# Patient Record
Sex: Female | Born: 2000 | Race: Black or African American | Hispanic: No | Marital: Single | Smoking: Never smoker
Health system: Southern US, Community
[De-identification: ages and names within clinical notes are randomized; demographics above are authoritative.]

## PROBLEM LIST (undated history)

## (undated) DIAGNOSIS — E559 Vitamin D deficiency, unspecified: Secondary | ICD-10-CM

## (undated) DIAGNOSIS — E611 Iron deficiency: Secondary | ICD-10-CM

## (undated) DIAGNOSIS — G43109 Migraine with aura, not intractable, without status migrainosus: Secondary | ICD-10-CM

## (undated) DIAGNOSIS — B9681 Helicobacter pylori [H. pylori] as the cause of diseases classified elsewhere: Secondary | ICD-10-CM

## (undated) DIAGNOSIS — K2970 Gastritis, unspecified, without bleeding: Secondary | ICD-10-CM

## (undated) DIAGNOSIS — E785 Hyperlipidemia, unspecified: Secondary | ICD-10-CM

## (undated) HISTORY — DX: Hyperlipidemia, unspecified: E78.5

## (undated) HISTORY — DX: Vitamin D deficiency, unspecified: E55.9

## (undated) HISTORY — DX: Helicobacter pylori (H. pylori) as the cause of diseases classified elsewhere: B96.81

## (undated) HISTORY — DX: Migraine with aura, not intractable, without status migrainosus: G43.109

## (undated) HISTORY — DX: Iron deficiency: E61.1

## (undated) HISTORY — DX: Helicobacter pylori (H. pylori) as the cause of diseases classified elsewhere: K29.70

---
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
## 2020-04-24 DIAGNOSIS — A599 Trichomoniasis, unspecified: Secondary | ICD-10-CM

## 2020-04-24 HISTORY — PX: WISDOM TOOTH EXTRACTION: SHX21

## 2020-04-24 HISTORY — DX: Trichomoniasis, unspecified: A59.9

## 2021-07-05 ENCOUNTER — Emergency Department (HOSPITAL_BASED_OUTPATIENT_CLINIC_OR_DEPARTMENT_OTHER): Payer: Medicaid Other

## 2021-07-05 ENCOUNTER — Emergency Department (HOSPITAL_BASED_OUTPATIENT_CLINIC_OR_DEPARTMENT_OTHER)
Admission: EM | Admit: 2021-07-05 | Discharge: 2021-07-05 | Disposition: A | Discharge status: Home / Self Care | Payer: Medicaid Other | Attending: Emergency Medicine | Admitting: Emergency Medicine

## 2021-07-05 ENCOUNTER — Other Ambulatory Visit: Payer: Self-pay

## 2021-07-05 ENCOUNTER — Encounter (HOSPITAL_BASED_OUTPATIENT_CLINIC_OR_DEPARTMENT_OTHER): Payer: Self-pay | Admitting: Emergency Medicine

## 2021-07-05 DIAGNOSIS — R791 Abnormal coagulation profile: Secondary | ICD-10-CM | POA: Insufficient documentation

## 2021-07-05 DIAGNOSIS — R519 Headache, unspecified: Secondary | ICD-10-CM | POA: Insufficient documentation

## 2021-07-05 DIAGNOSIS — R0602 Shortness of breath: Secondary | ICD-10-CM | POA: Diagnosis present

## 2021-07-05 DIAGNOSIS — J020 Streptococcal pharyngitis: Secondary | ICD-10-CM

## 2021-07-05 DIAGNOSIS — R079 Chest pain, unspecified: Secondary | ICD-10-CM | POA: Diagnosis not present

## 2021-07-05 DIAGNOSIS — Z20822 Contact with and (suspected) exposure to covid-19: Secondary | ICD-10-CM | POA: Diagnosis not present

## 2021-07-05 LAB — CBC WITH DIFFERENTIAL/PLATELET
Abs Immature Granulocytes: 0.01 10*3/uL (ref 0.00–0.07)
Basophils Absolute: 0 10*3/uL (ref 0.0–0.1)
Basophils Relative: 0 %
Eosinophils Absolute: 0.1 10*3/uL (ref 0.0–0.5)
Eosinophils Relative: 1 %
HCT: 41.7 % (ref 36.0–46.0)
Hemoglobin: 14 g/dL (ref 12.0–15.0)
Immature Granulocytes: 0 %
Lymphocytes Relative: 25 %
Lymphs Abs: 1.6 10*3/uL (ref 0.7–4.0)
MCH: 30.7 pg (ref 26.0–34.0)
MCHC: 33.6 g/dL (ref 30.0–36.0)
MCV: 91.4 fL (ref 80.0–100.0)
Monocytes Absolute: 0.4 10*3/uL (ref 0.1–1.0)
Monocytes Relative: 5 %
Neutro Abs: 4.4 10*3/uL (ref 1.7–7.7)
Neutrophils Relative %: 69 %
Platelets: 319 10*3/uL (ref 150–400)
RBC: 4.56 MIL/uL (ref 3.87–5.11)
RDW: 13 % (ref 11.5–15.5)
WBC: 6.4 10*3/uL (ref 4.0–10.5)
nRBC: 0 % (ref 0.0–0.2)

## 2021-07-05 LAB — COMPREHENSIVE METABOLIC PANEL
ALT: 23 U/L (ref 0–44)
AST: 20 U/L (ref 15–41)
Albumin: 4.1 g/dL (ref 3.5–5.0)
Alkaline Phosphatase: 55 U/L (ref 38–126)
Anion gap: 8 (ref 5–15)
BUN: 10 mg/dL (ref 6–20)
CO2: 25 mmol/L (ref 22–32)
Calcium: 9.6 mg/dL (ref 8.9–10.3)
Chloride: 103 mmol/L (ref 98–111)
Creatinine, Ser: 0.75 mg/dL (ref 0.44–1.00)
GFR, Estimated: >60 mL/min (ref >60)
Glucose, Bld: 83 mg/dL (ref 70–99)
Potassium: 4 mmol/L (ref 3.5–5.1)
Sodium: 136 mmol/L (ref 135–145)
Total Bilirubin: 0.5 mg/dL (ref 0.3–1.2)
Total Protein: 8.5 g/dL — ABNORMAL HIGH (ref 6.5–8.1)

## 2021-07-05 LAB — URINALYSIS, ROUTINE W REFLEX MICROSCOPIC
Bilirubin Urine: NEGATIVE
Glucose, UA: NEGATIVE mg/dL
Hgb urine dipstick: NEGATIVE
Ketones, ur: NEGATIVE mg/dL
Leukocytes,Ua: NEGATIVE
Nitrite: NEGATIVE
Protein, ur: NEGATIVE mg/dL
Specific Gravity, Urine: 1.015 (ref 1.005–1.030)
pH: 7 (ref 5.0–8.0)

## 2021-07-05 LAB — RESP PANEL BY RT-PCR (FLU A&B, COVID) ARPGX2
Influenza A by PCR: NEGATIVE
Influenza B by PCR: NEGATIVE
SARS Coronavirus 2 by RT PCR: NEGATIVE

## 2021-07-05 LAB — GROUP A STREP BY PCR: Group A Strep by PCR: DETECTED — AB

## 2021-07-05 LAB — D-DIMER, QUANTITATIVE: D-Dimer, Quant: 0.63 ug/mL-FEU — ABNORMAL HIGH (ref 0.00–0.50)

## 2021-07-05 LAB — PREGNANCY, URINE: Preg Test, Ur: NEGATIVE

## 2021-07-05 MED ORDER — AMOXICILLIN 500 MG PO CAPS: 500.0000 mg | ORAL_CAPSULE | Freq: Three times a day (TID) | ORAL | 0 refills | Status: DC

## 2021-07-05 MED ORDER — IOHEXOL 350 MG/ML SOLN
75.0000 mL | Freq: Once | INTRAVENOUS | Status: AC | PRN
  Administered 2021-07-05: 75 mL via INTRAVENOUS

## 2021-07-05 MED ORDER — KETOROLAC TROMETHAMINE 30 MG/ML IJ SOLN
30.0000 mg | Freq: Once | INTRAMUSCULAR | Status: AC
  Administered 2021-07-05: 30 mg via INTRAVENOUS
  Filled 2021-07-05: qty 1

## 2021-07-05 MED ORDER — SODIUM CHLORIDE 0.9 % IV BOLUS
1000.0000 mL | Freq: Once | INTRAVENOUS | Status: AC
  Administered 2021-07-05: 1000 mL via INTRAVENOUS

## 2021-07-05 NOTE — ED Triage Notes (Signed)
SOB since this am.  Pt states she was sitting down writing when it started.  Pt has shallow respirations.  Hyperventilating.  Trembling.  Pt also c/o headache.  No recent travel.  Pt takes birth control and smokes.  Pox with ambulation was 100 %. ?

## 2021-07-05 NOTE — ED Provider Notes (Signed)
?[facility] EMERGENCY DEPARTMENT ?Provider Note ? ? ?CSN: [id number] ?Arrival date & time: 07/05/21  1143 ? ?  ? ?History ? ?Chief Complaint  ?Patient presents with  ? Shortness of Breath  ? ? ?Grace Marshall is a 21 y.o. female. ? ?Pt is a 21 yo female with no significant pmhx.  She said she has had sob with left sided cp.  She also has a headache.  She had a cold a few days ago.  She did a home covid test and it was negative.  Pt denies f/c.   ? ? ?  ? ?Home Medications ?Prior to Admission medications   ?Not on File  ?   ? ?Allergies    ?Patient has no known allergies.   ? ?Review of Systems   ?Review of Systems  ?HENT:  Positive for congestion.   ?Respiratory:  Positive for shortness of breath.   ?Cardiovascular:  Positive for chest pain.  ?Neurological:  Negative for headaches.  ?All other systems reviewed and are negative. ? ?Physical Exam ?Updated Vital Signs ?BP 105/71   Pulse 62   Temp 97.7 ?F (36.5 ?C)   Resp 18   Ht 5\' 2"  (1.575 m)   Wt 77 kg   LMP 06/27/2021   SpO2 100%   BMI 31.04 kg/m?  ?Physical Exam ?Vitals and nursing note reviewed.  ?Constitutional:   ?   Appearance: She is well-developed.  ?HENT:  ?   Head: Normocephalic and atraumatic.  ?   Mouth/Throat:  ?   Tonsils: 4+ on the right. 4+ on the left.  ?Eyes:  ?   Extraocular Movements: Extraocular movements intact.  ?   Pupils: Pupils are equal, round, and reactive to light.  ?Cardiovascular:  ?   Rate and Rhythm: Normal rate and regular rhythm.  ?Pulmonary:  ?   Effort: Pulmonary effort is normal.  ?   Breath sounds: Normal breath sounds.  ?Abdominal:  ?   General: Bowel sounds are normal.  ?   Palpations: Abdomen is soft.  ?Musculoskeletal:     ?   General: Normal range of motion.  ?   Cervical back: Normal range of motion and neck supple.  ?Skin: ?   General: Skin is warm.  ?   Capillary Refill: Capillary refill takes less than 2 seconds.  ?Neurological:  ?   General: No focal deficit present.  ?   Mental Status: She is alert  and oriented to person, place, and time.  ?Psychiatric:     ?   Mood and Affect: Mood normal.     ?   Behavior: Behavior normal.  ? ? ?ED Results / Procedures / Treatments   ?Labs ?(all labs ordered are listed, but only abnormal results are displayed) ?Labs Reviewed  ?COMPREHENSIVE METABOLIC PANEL - Abnormal; Notable for the following components:  ?    Result Value  ? Total Protein 8.5 (*)   ? All other components within normal limits  ?D-DIMER, QUANTITATIVE (NOT AT [facility]) - Abnormal; Notable for the following components:  ? D-Dimer, Quant 0.63 (*)   ? All other components within normal limits  ?RESP PANEL BY RT-PCR (FLU A&B, COVID) ARPGX2  ?GROUP A STREP BY PCR  ?CBC WITH DIFFERENTIAL/PLATELET  ?URINALYSIS, ROUTINE W REFLEX MICROSCOPIC  ?PREGNANCY, URINE  ? ? ?EKG ?EKG Interpretation ? ?Date/Time:  Tuesday July 05 2021 14:39:49 EDT ?Ventricular Rate:  73 ?PR Interval:  160 ?QRS Duration: 87 ?QT Interval:  392 ?QTC Calculation: 432 ?R Axis:  85 ?Text Interpretation: Sinus arrhythmia No old tracing to compare Confirmed by Jacalyn Lefevre [phone]) on 07/05/2021 4:29:41 PM ? ?Radiology ?DG Chest 2 View ? ?Result Date: 07/05/2021 ?CLINICAL DATA:  Chest pain.  Shortness of breath. EXAM: CHEST - 2 VIEW COMPARISON:  None. FINDINGS: Cardiac silhouette and mediastinal contours are within normal limits. The lungs are clear. No pleural effusion or pneumothorax. No acute skeletal abnormality. IMPRESSION: No active cardiopulmonary disease. Electronically Signed   By: Neita Garnet M.D.   On: 07/05/2021 14:42  ? ?CT Head Wo Contrast ? ?Result Date: 07/05/2021 ?CLINICAL DATA:  Severe headaches EXAM: CT HEAD WITHOUT CONTRAST TECHNIQUE: Contiguous axial images were obtained from the base of the skull through the vertex without intravenous contrast. RADIATION DOSE REDUCTION: This exam was performed according to the departmental dose-optimization program which includes automated exposure control, adjustment of the mA and/or kV according  to patient size and/or use of iterative reconstruction technique. COMPARISON:  None. FINDINGS: Brain: No acute intracranial findings are seen. Ventricles are not dilated. There is no shift of midline structures. There are no epidural or subdural fluid collections. Vascular: Unremarkable. Skull: Unremarkable. Sinuses/Orbits: Unremarkable. Other: None IMPRESSION: No acute intracranial findings are seen in noncontrast CT brain. Electronically Signed   By: Ernie Avena M.D.   On: 07/05/2021 14:44   ? ?Procedures ?Procedures  ? ? ?Medications Ordered in ED ?Medications  ?ketorolac (TORADOL) 30 MG/ML injection 30 mg (has no administration in time range)  ?sodium chloride 0.9 % bolus 1,000 mL (1,000 mLs Intravenous New Bag/Given 07/05/21 1509)  ? ? ?ED Course/ Medical Decision Making/ A&P ?Clinical Course as of 07/05/21 1631  ?Tue Jul 05, 2021  ?1628 CT Head Wo Contrast [[facility]]  ?  ?Clinical Course User Index ?[[facility]] Jacalyn Lefevre, MD  ? ?                        ?Medical Decision Making ?Amount and/or Complexity of Data Reviewed ?Labs: ordered. ?Radiology: ordered. ? ?Risk ?Prescription drug management. ? ? ?This patient presents to the ED for concern of headache and sob with cp, this involves an extensive number of treatment options, and is a complaint that carries with it a high risk of complications and morbidity.  The differential diagnosis includes brain abn, pulmonary abn, cardiac abn ? ? ?Co morbidities that complicate the patient evaluation ? ?none ? ? ?Additional history obtained: ? ?Additional history obtained from epic chart review ?External records from outside source obtained and reviewed including friend ? ? ?Lab Tests: ? ?I Ordered, and personally interpreted labs.  The pertinent results include:  cbc nl, cmp nl, ua neg, covid/flu neg, preg neg ? ? ?Imaging Studies ordered: ? ?I ordered imaging studies including ct head and cxr and ct chest  ?I independently visualized and interpreted imaging which showed  CT head: ?  ?IMPRESSION:  ?No acute intracranial findings are seen in noncontrast CT brain.  ?   ?CXR: ?  ?IMPRESSION:  ?No active cardiopulmonary disease.  ? ?I agree with the radiologist interpretation ? ? ?Cardiac Monitoring: ? ?The patient was maintained on a cardiac monitor.  I personally viewed and interpreted the cardiac monitored which showed an underlying rhythm of: nsr ? ? ?Medicines ordered and prescription drug management: ? ?I ordered medication including toradol/ivfs  for pain  ?Reevaluation of the patient after these medicines showed that the patient improved ?I have reviewed the patients home medicines and have made adjustments as needed ? ? ?Test Considered: ? ?  Ct chest:  result signed out to Dr. Deretha EmoryZackowski ? ? ?Problem List / ED Course: ? ?Headache:  re-eval after toradol and fluids ?CP and sob:  chest ct pending  ? ? ?Social Determinants of Health: ? ?Lives at home ? ?Pt signed out to Dr. Deretha EmoryZackowski at shift change. ? ? ? ? ? ? ? ?Final Clinical Impression(s) / ED Diagnoses ?Final diagnoses:  ?SOB (shortness of breath)  ?Acute nonintractable headache, unspecified headache type  ? ? ?Rx / DC Orders ?ED Discharge Orders   ? ? None  ? ?  ? ? ?  ?Jacalyn LefevreHaviland, Melodee Lupe, MD ?07/05/21 1631 ? ?

## 2021-07-05 NOTE — ED Provider Notes (Signed)
Patient overall feeling better.  Strep test is positive.  Patient may be a strep carrier she was treated several weeks ago for positive strep.  Tonsils are still enlarged.  Patient without significant plaint of sore throat.  But will retreat with amoxicillin patient if gets significantly better on that that probably did help.  Otherwise she may be a carrier.  D-dimer was elevated so she had CT angio chest.  The CT angio chest had no acute findings.  No PE no evidence of any pneumonia. ? ?Patient will follow-up with primary care doctor will take the amoxicillin.  Work note provided.  Will return for any new or worse symptoms. ?  ?Vanetta Mulders, MD ?07/05/21 1831 ? ?

## 2021-07-05 NOTE — Discharge Instructions (Addendum)
Strep was positive.  As we talked you may be a strep carrier but take the antibiotic as directed 3 times a day.  COVID flu testing negative the specialized CAT scan of your chest ruled out any blood clots or any concerns for pneumonia.  Work note provided to be out of work for 2 days.  Can appointment to follow-up with your regular doctor. ?

## 2021-07-05 NOTE — ED Notes (Signed)
Pt. Walked to restroom with no difficulty 

## 2021-07-05 NOTE — ED Notes (Signed)
Pt. Reports she has a headache starting this morning at 9am that was different than a normal migraine. Pt. Reports the headache is on the L side over the L brow area. ? 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 ?

## 2022-06-17 ENCOUNTER — Emergency Department (HOSPITAL_BASED_OUTPATIENT_CLINIC_OR_DEPARTMENT_OTHER)
Admission: EM | Admit: 2022-06-17 | Discharge: 2022-06-17 | Disposition: A | Discharge status: Home / Self Care | Payer: Medicaid Other | Attending: Emergency Medicine | Admitting: Emergency Medicine

## 2022-06-17 ENCOUNTER — Other Ambulatory Visit: Payer: Self-pay

## 2022-06-17 ENCOUNTER — Encounter (HOSPITAL_BASED_OUTPATIENT_CLINIC_OR_DEPARTMENT_OTHER): Payer: Self-pay | Admitting: Emergency Medicine

## 2022-06-17 DIAGNOSIS — M25511 Pain in right shoulder: Secondary | ICD-10-CM | POA: Insufficient documentation

## 2022-06-17 MED ORDER — METHYLPREDNISOLONE 4 MG PO TBPK: ORAL_TABLET | ORAL | 0 refills | Status: DC

## 2022-06-17 NOTE — ED Triage Notes (Signed)
Pt c/o right shoulder pain that radiates down to fingers x 1-2 weeks. Pt seen by PCP for same and had an x-ray done that was negative. Pt is waiting clearance from insurance for MRI to be done. Pt denies inury.

## 2022-06-17 NOTE — ED Provider Notes (Signed)
[facility] Provider Note   CSN: [id number] Arrival date & time: 06/17/22  0815     History  Chief Complaint  Patient presents with   Shoulder Pain    Grace Marshall is a 22 y.o. female.  Patient here with right shoulder pain for the last 1 to 2 weeks.  X-rays done with primary care doctor were normal.  Is awaiting to have MRI done.  Has not had physical therapy.  Taken over-the-counter medications without much relief.  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  History of athletics in the past.  Denies any prior trauma.  Denies any weakness or numbness.  Sometimes she will have some tingling down her arm.  Denies any neck pain, loss consciousness or headache.  The history is provided by the patient.       Home Medications Prior to Admission medications   Medication Sig Start Date End Date Taking? Authorizing Provider  methylPREDNISolone (MEDROL DOSEPAK) 4 MG TBPK tablet Follow package insert 06/17/22  Yes Tyr Franca, DO  amoxicillin (AMOXIL) 500 MG capsule Take 1 capsule (500 mg total) by mouth 3 (three) times daily. 07/05/21   Fredia Sorrow, MD      Allergies    Patient has no known allergies.    Review of Systems   Review of Systems  Physical Exam Updated Vital Signs BP 122/83   Pulse 86   Temp 98.9 F (37.2 C)   Resp 17   Ht '5\' 2"'$  (1.575 m)   Wt 87.5 kg   LMP 06/03/2022   SpO2 98%   BMI 35.30 kg/m  Physical Exam Vitals and nursing note reviewed.  Constitutional:      General: She is not in acute distress.    Appearance: She is well-developed. She is not ill-appearing.  HENT:     Head: Normocephalic and atraumatic.     Nose: Nose normal.     Mouth/Throat:     Mouth: Mucous membranes are moist.  Eyes:     Extraocular Movements: Extraocular movements intact.     Conjunctiva/sclera: Conjunctivae normal.     Pupils: Pupils are equal, round, and reactive to light.  Cardiovascular:      Rate and Rhythm: Normal rate and regular rhythm.     Heart sounds: No murmur heard. Pulmonary:     Effort: Pulmonary effort is normal. No respiratory distress.     Breath sounds: Normal breath sounds.  Abdominal:     Palpations: Abdomen is soft.     Tenderness: There is no abdominal tenderness.  Musculoskeletal:        General: Tenderness present. No swelling. Normal range of motion.     Cervical back: Normal range of motion and neck supple.     Comments: Tenderness around the right bicipital groove/right AC  Skin:    General: Skin is warm and dry.     Capillary Refill: Capillary refill takes less than 2 seconds.  Neurological:     General: No focal deficit present.     Mental Status: She is alert and oriented to person, place, and time.     Cranial Nerves: No cranial nerve deficit.     Sensory: No sensory deficit.     Motor: No weakness.     Coordination: Coordination normal.     Comments: 5+ out of 5 strength, normal sensation  Psychiatric:        Mood and Affect: Mood normal.  ED Results / Procedures / Treatments   Labs (all labs ordered are listed, but only abnormal results are displayed) Labs Reviewed - No data to display  EKG None  Radiology No results found.  Procedures Procedures    Medications Ordered in ED Medications - No data to display  ED Course/ Medical Decision Making/ A&P                             Medical Decision Making Risk Prescription drug management.   Teather Jasso is here with right shoulder pain.  Normal vitals.  No fever.  Differential diagnosis likely soft tissue process or sprain or strain overuse injury.  Already had x-ray with primary care doctor that was normal.  Has been sent for MRI but has not gotten it yet.  Overall she is neurovascular neuromuscular intact.  My suspicion is that this is some sort of tendinitis or inflammatory process.  No concern for infectious process.  Neurologically neurovascular she is intact.  Will  have her follow-up with sports medicine.  Will put her in for some steroids to see if we can help with any inflammation.  Recommend continued use of Tylenol.  Discharged in good condition.  This chart was dictated using voice recognition software.  Despite best efforts to proofread,  errors can occur which can change the documentation meaning.         Final Clinical Impression(s) / ED Diagnoses Final diagnoses:  Acute pain of right shoulder    Rx / DC Orders ED Discharge Orders          Ordered    methylPREDNISolone (MEDROL DOSEPAK) 4 MG TBPK tablet        06/17/22 0831              Lennice Sites, DO 06/17/22 [phone]

## 2022-06-23 ENCOUNTER — Ambulatory Visit: Payer: Self-pay

## 2022-06-23 ENCOUNTER — Encounter: Payer: Self-pay | Admitting: Family Medicine

## 2022-06-23 ENCOUNTER — Ambulatory Visit (INDEPENDENT_AMBULATORY_CARE_PROVIDER_SITE_OTHER): Payer: Medicaid Other | Admitting: Family Medicine

## 2022-06-23 VITALS — BP 112/70 | Ht 62.0 in | Wt 193.0 lb

## 2022-06-23 DIAGNOSIS — M25511 Pain in right shoulder: Secondary | ICD-10-CM

## 2022-06-23 DIAGNOSIS — M65811 Other synovitis and tenosynovitis, right shoulder: Secondary | ICD-10-CM

## 2022-06-23 DIAGNOSIS — S43431D Superior glenoid labrum lesion of right shoulder, subsequent encounter: Secondary | ICD-10-CM | POA: Insufficient documentation

## 2022-06-23 MED ORDER — CELECOXIB 100 MG PO CAPS: 100.0000 mg | ORAL_CAPSULE | Freq: Two times a day (BID) | ORAL | 1 refills | Status: DC

## 2022-06-23 NOTE — Patient Instructions (Signed)
Nice to meet you Please try heat  Please try the range of motion movements  We'll call with the results.   Please send me a message in MyChart with any questions or updates.  Please see me back in 2-3 weeks.   --Dr. Raeford Razor

## 2022-06-23 NOTE — Assessment & Plan Note (Signed)
Acute occurring with effusion in the shoulder joint. No injury. Appears more inflammatory than mechanical source - counseled on home exercise therapy and supportive care  - celebrex  - ana, sed rate, crp, ck  - could consider further imaging or injection

## 2022-06-23 NOTE — Progress Notes (Signed)
  Grace Marshall - 22 y.o. female MRN [id number]  Date of birth: 2000-10-31  SUBJECTIVE:  Including CC & ROS.  No chief complaint on file.   Grace Marshall is a 22 y.o. female that is presenting with acute right shoulder pain.  The pain is severe in nature.  Has been ongoing over the past few weeks.  Localized to the shoulder for most the pain but she does experience numbness down to the middle 2 fingers.  No injury inciting event.  She works as a Education administrator..  Review of the emergency note from 2/24 shows she was provided with Medrol Dosepak.   Review of Systems See HPI   HISTORY: Past Medical, Surgical, Social, and Family History Reviewed & Updated per EMR.   Pertinent Historical Findings include:  History reviewed. No pertinent past medical history.  History reviewed. No pertinent surgical history.   PHYSICAL EXAM:  VS: BP 112/70   Ht '5\' 2"'$  (1.575 m)   Wt 193 lb (87.5 kg)   LMP 06/03/2022   BMI 35.30 kg/m  Physical Exam Gen: NAD, alert, cooperative with exam, well-appearing MSK:  Neurovascularly intact    Limited ultrasound: Right shoulder pain:  Effusion appreciated within the bicipital tendon sheath and overlying thickness near the insertion. Normal-appearing subscapularis. Mild subacromial bursitis. No changes in the posterior glenohumeral joint.  Summary: Findings consistent with effusion of the biceps tendon sheath with synovitis.  Ultrasound and interpretation by Clearance Coots, MD    ASSESSMENT & PLAN:   Synovitis of right shoulder Acute occurring with effusion in the shoulder joint. No injury. Appears more inflammatory than mechanical source - counseled on home exercise therapy and supportive care  - celebrex  - ana, sed rate, crp, ck  - could consider further imaging or injection

## 2022-06-24 LAB — SEDIMENTATION RATE: Sed Rate: 6 mm/hr (ref 0–32)

## 2022-06-28 LAB — ANA,IFA RA DIAG PNL W/RFLX TIT/PATN
ANA Titer 1: NEGATIVE
Cyclic Citrullin Peptide Ab: 6 units (ref 0–19)
Rheumatoid fact SerPl-aCnc: <10 IU/mL (ref <14.0)

## 2022-06-28 LAB — C-REACTIVE PROTEIN: CRP: 6 mg/L (ref 0–10)

## 2022-06-28 LAB — CK: Total CK: 83 U/L (ref 32–182)

## 2022-06-30 ENCOUNTER — Telehealth: Payer: Self-pay | Admitting: Family Medicine

## 2022-06-30 NOTE — Telephone Encounter (Signed)
Unable to leave VM for patient. If she calls back please have her speak with a nurse/CMA and inform that her labs are normal. We'll continue with current plan.   If any questions then please take the best time and phone number to call and I will try to call her back.   Rosemarie Ax, MD [facility] 06/30/2022, 10:37 AM

## 2022-07-12 ENCOUNTER — Other Ambulatory Visit (HOSPITAL_BASED_OUTPATIENT_CLINIC_OR_DEPARTMENT_OTHER): Payer: Self-pay

## 2022-07-12 ENCOUNTER — Ambulatory Visit (INDEPENDENT_AMBULATORY_CARE_PROVIDER_SITE_OTHER): Payer: Medicaid Other | Admitting: Family Medicine

## 2022-07-12 ENCOUNTER — Encounter: Payer: Self-pay | Admitting: Family Medicine

## 2022-07-12 VITALS — BP 120/60 | Ht 62.0 in | Wt 193.0 lb

## 2022-07-12 DIAGNOSIS — M65811 Other synovitis and tenosynovitis, right shoulder: Secondary | ICD-10-CM | POA: Diagnosis not present

## 2022-07-12 MED ORDER — CELECOXIB 100 MG PO CAPS
100.0000 mg | ORAL_CAPSULE | Freq: Two times a day (BID) | ORAL | 1 refills | Status: DC
  Filled 2022-07-12 (×2): qty 60, 30d supply, fill #0

## 2022-07-12 NOTE — Progress Notes (Signed)
  Grace Marshall - 22 y.o. female MRN [id number]  Date of birth: 2000-10-30  SUBJECTIVE:  Including CC & ROS.  No chief complaint on file.   Grace Marshall is a 22 y.o. female that is presenting with ongoing right shoulder pain.  She does get improvement with Celebrex.  Pain is still ongoing and is appreciated around the joint.    Review of Systems See HPI   HISTORY: Past Medical, Surgical, Social, and Family History Reviewed & Updated per EMR.   Pertinent Historical Findings include:  History reviewed. No pertinent past medical history.  History reviewed. No pertinent surgical history.   PHYSICAL EXAM:  VS: BP 120/60 (BP Location: Left Arm, Patient Position: Sitting)   Ht 5\' 2"  (1.575 m)   Wt 193 lb (87.5 kg)   LMP 06/03/2022   BMI 35.30 kg/m  Physical Exam Gen: NAD, alert, cooperative with exam, well-appearing MSK:  Neurovascularly intact       ASSESSMENT & PLAN:   Synovitis of right shoulder Continues to have pain but does get relief with the anti-inflammatory.  Lab work was unrevealing.  X-rays of been unrevealing as well.  She will get those faxed to this office. -Counseled on home exercise therapy and supportive care. -Refilled Celebrex. -Could consider further imaging or injection

## 2022-07-12 NOTE — Patient Instructions (Signed)
Good to see you Please use heat  Please continue the exercises  Please try taking the medicine on a daily basis   Please send me a message in MyChart with any questions or updates.  Please see me back in 3 weeks.   --Dr. Raeford Razor

## 2022-07-12 NOTE — Assessment & Plan Note (Signed)
Continues to have pain but does get relief with the anti-inflammatory.  Lab work was unrevealing.  X-rays of been unrevealing as well.  She will get those faxed to this office. -Counseled on home exercise therapy and supportive care. -Refilled Celebrex. -Could consider further imaging or injection

## 2022-07-13 ENCOUNTER — Ambulatory Visit: Payer: Medicaid Other | Admitting: Family Medicine

## 2022-08-03 ENCOUNTER — Other Ambulatory Visit (HOSPITAL_BASED_OUTPATIENT_CLINIC_OR_DEPARTMENT_OTHER): Payer: Self-pay

## 2022-08-03 ENCOUNTER — Ambulatory Visit (HOSPITAL_BASED_OUTPATIENT_CLINIC_OR_DEPARTMENT_OTHER)
Admission: RE | Admit: 2022-08-03 | Discharge: 2022-08-03 | Disposition: A | Discharge status: Home / Self Care | Payer: Medicaid Other | Source: Ambulatory Visit | Attending: Family Medicine | Admitting: Family Medicine

## 2022-08-03 ENCOUNTER — Ambulatory Visit (INDEPENDENT_AMBULATORY_CARE_PROVIDER_SITE_OTHER): Payer: Medicaid Other | Admitting: Family Medicine

## 2022-08-03 ENCOUNTER — Encounter: Payer: Self-pay | Admitting: Family Medicine

## 2022-08-03 VITALS — BP 108/80 | Ht 62.0 in | Wt 193.0 lb

## 2022-08-03 DIAGNOSIS — S43431D Superior glenoid labrum lesion of right shoulder, subsequent encounter: Secondary | ICD-10-CM

## 2022-08-03 MED ORDER — HYDROCODONE-ACETAMINOPHEN 5-325 MG PO TABS
1.0000 | ORAL_TABLET | Freq: Three times a day (TID) | ORAL | 0 refills | Status: DC | PRN
  Filled 2022-08-03: qty 15, 5d supply, fill #0

## 2022-08-03 NOTE — Progress Notes (Signed)
  Grace Marshall - 22 y.o. female MRN [id number]  Date of birth: [birth year]  SUBJECTIVE:  Including CC & ROS.  No chief complaint on file.   Grace Marshall is a 22 y.o. female that is presenting with acute on chronic right shoulder pain.  The pain has been present for several months.  She has had different medication such as Celebrex and prednisone with limited improvement..    Review of Systems See HPI   HISTORY: Past Medical, Surgical, Social, and Family History Reviewed & Updated per EMR.   Pertinent Historical Findings include:  History reviewed. No pertinent past medical history.  History reviewed. No pertinent surgical history.   PHYSICAL EXAM:  VS: BP 108/80 (BP Location: Left Arm, Patient Position: Sitting)   Ht 5\' 2"  (1.575 m)   Wt 193 lb (87.5 kg)   BMI 35.30 kg/m  Physical Exam Gen: NAD, alert, cooperative with exam, well-appearing MSK:  Right shoulder: Limited range of motion Rotation. Limited strength resistance. Diminished deep tendon reflexes at the biceps Positive empty can test. 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  Symptoms been present for several months.  She has tried greater than 6 weeks of physician directed home exercise therapy.  This included isometrics with external and internal rotation that was held for 5 seconds repeated 10 times per side.  This was done on a daily basis and she showed no improvement over a 6-week course.  Previous imaging has been unrevealing.  She did not improve with Celebrex or prednisone. -Counseled on home exercise therapy and supportive care. -Norco. -MRI of the right shoulder to evaluate for synovitis and a labral tear and for presurgical planning.

## 2022-08-03 NOTE — Assessment & Plan Note (Signed)
Acute on chronic in nature.  Symptoms been present for several months.  She has tried greater than 6 weeks of physician directed home exercise therapy.  This included isometrics with external and internal rotation that was held for 5 seconds repeated 10 times per side.  This was done on a daily basis and she showed no improvement over a 6-week course.  Previous imaging has been unrevealing.  She did not improve with Celebrex or prednisone. -Counseled on home exercise therapy and supportive care. -Norco. -MRI of the right shoulder to evaluate for synovitis and a labral tear and for presurgical planning.

## 2022-08-03 NOTE — Patient Instructions (Signed)
Good to see you Please use heat  Please use the pain medicine as needed  We'll call with the xray results  We'll obtain the MRi at [facility] imaging   Please send me a message in MyChart with any questions or updates.  We'll setup a virtual visit once the MRi is resutled.   --Dr. Jordan Likes

## 2022-08-04 ENCOUNTER — Telehealth: Payer: Self-pay | Admitting: Family Medicine

## 2022-08-04 NOTE — Telephone Encounter (Signed)
Informed of results.   Myra Rude, MD [facility] 08/04/2022, 10:28 AM

## 2022-08-07 ENCOUNTER — Encounter: Payer: Self-pay | Admitting: *Deleted

## 2022-08-22 ENCOUNTER — Encounter: Payer: Self-pay | Admitting: Family

## 2022-08-22 ENCOUNTER — Ambulatory Visit (INDEPENDENT_AMBULATORY_CARE_PROVIDER_SITE_OTHER): Payer: Medicaid Other | Admitting: Family

## 2022-08-22 ENCOUNTER — Other Ambulatory Visit (HOSPITAL_BASED_OUTPATIENT_CLINIC_OR_DEPARTMENT_OTHER): Payer: Self-pay

## 2022-08-22 VITALS — BP 107/67 | HR 82 | Temp 98.0°F | Resp 16 | Ht 62.3 in | Wt 195.0 lb

## 2022-08-22 DIAGNOSIS — B351 Tinea unguium: Secondary | ICD-10-CM | POA: Diagnosis not present

## 2022-08-22 DIAGNOSIS — B353 Tinea pedis: Secondary | ICD-10-CM

## 2022-08-22 DIAGNOSIS — Z720 Tobacco use: Secondary | ICD-10-CM

## 2022-08-22 DIAGNOSIS — S43431D Superior glenoid labrum lesion of right shoulder, subsequent encounter: Secondary | ICD-10-CM

## 2022-08-22 MED ORDER — VARENICLINE TARTRATE (STARTER) 0.5 MG X 11 & 1 MG X 42 PO TBPK
ORAL_TABLET | ORAL | 0 refills | Status: DC
  Filled 2022-08-22: qty 53, 30d supply, fill #0

## 2022-08-22 MED ORDER — TERBINAFINE HCL 250 MG PO TABS
250.0000 mg | ORAL_TABLET | Freq: Every day | ORAL | 0 refills | Status: DC
  Filled 2022-08-22: qty 30, 30d supply, fill #0

## 2022-08-22 NOTE — Assessment & Plan Note (Signed)
She is vaping and wants to quit. Will give her a trial of chantix. Discussed side effects including rare side effect of suicide ideation. She is advised to discontinue right away and let us know if she develops any symptoms of depression.

## 2022-08-22 NOTE — Assessment & Plan Note (Signed)
She has an MRI scheduled and is working closely with sports medicine on this.

## 2022-08-22 NOTE — Assessment & Plan Note (Signed)
Will rx with lamisil as noted above.

## 2022-08-22 NOTE — Progress Notes (Signed)
Subjective:   By signing my name below, I, Carlena Bjornstad, attest that this documentation has been prepared under the direction and in the presence of Sandford Craze, NP.  08/22/2022.   Patient ID: Grace Marshall, female    DOB: Mar 15, 2001, 22 y.o.   MRN: [id number]  No chief complaint on file.   HPI Patient is in today for establishment of patient care.  Shoulder pain:  She complains of "excruciating" right shoulder pain. She has been working with physical therapy. MRI is scheduled for this Sunday.  Feet:  She believes she may have a fungal infection of the skin and toenails of her feet. The onset of her symptoms seems to correlate with a recent pedicure. She confirms that her toenails appear discolored underneath the polish.  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 She has a 64 yo child of her own. She is currently living with her mother. In her free time she enjoys reading. Her highest is high school and college graduate. Her only recent procedure was extraction of her wisdom teeth in 2022. Occasional alcohol consumption. She has used vapes for about 2 years, 5% nicotine. She uses a vape often. She is interested in quitting. No tobacco use. No drug use.  Family history:  She is adopted. Her biological mother is deceased. She doesn't know her mother's medical history. Her biological father is living and has diabetes and hypertension.  She has 1 full brother who is healthy. She has 2 half brothers who are both healthy. Her maternal grandparents are both deceased; she is unsure of their medical history. Her paternal grandparents both have diabetes and hypertension.  Pap Smear/Mammogram:  Never completed.  Denies having any fever, new muscle pain, new moles, congestion, sinus pain, sore throat, chest pain, palpitations, cough, SOB, wheezing, n/v/d, constipation, blood in stool, dysuria, frequency, hematuria, at this time.  History reviewed. No  pertinent past medical history.  Past Surgical History:  Procedure Laterality Date   WISDOM TOOTH EXTRACTION  2022    Family History  Adopted: Yes  Problem Relation Age of Onset   Hypertension Father    Diabetes Father    Hypertension Paternal Grandmother    Diabetes Paternal Grandmother    Hypertension Paternal Grandfather    Diabetes Paternal Grandfather     Social History   Socioeconomic History   Marital status: Single    Spouse name: Not on file   Number of children: 1   Years of education: Not on file   Highest education level: Not on file  Occupational History   Occupation: Pharmacologist   Occupation: Runner, broadcasting/film/video  Tobacco Use   Smoking status: Never   Smokeless tobacco: Never  Vaping Use   Vaping Use: Every day   Start date: 07/23/2020  Substance and Sexual Activity   Alcohol use: Yes    Comment: occasionaly   Drug use: Not Currently   Sexual activity: Not Currently    Birth control/protection: Inserts    Comment: nexplanon  Other Topics Concern   Not on file  Social History Narrative   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 21 capsule 0   celecoxib (CELEBREX) 100 MG capsule Take 1 capsule (100 mg total) by mouth 2 (two) times daily. 60 capsule 1   HYDROcodone-acetaminophen (NORCO/VICODIN) 5-325 MG tablet Take 1 tablet by mouth every 8 (eight) hours as needed. 15 tablet 0   methylPREDNISolone (MEDROL DOSEPAK) 4 MG TBPK tablet  Follow package insert 21 each 0   No facility-administered medications prior to visit.    No Known Allergies  Review of Systems  Constitutional:  Negative for fever.  HENT:  Negative for congestion, sinus pain and sore throat.   Respiratory:  Negative for cough, shortness of breath and wheezing.   Cardiovascular:  Negative for chest pain and palpitations.  Gastrointestinal:  Negative for blood in stool, constipation, diarrhea, nausea and vomiting.  Genitourinary:  Negative for dysuria, frequency and hematuria.  Musculoskeletal:  Positive for joint pain (Right shoulder). Negative for myalgias.  Skin:        (+) Fungal infection of skin and toenails of bilateral feet (-) New moles.       Objective:    Physical Exam Constitutional:      Appearance: Normal appearance.  HENT:     Head: Normocephalic and atraumatic.     Right Ear: Tympanic membrane, ear canal and external ear normal.     Left Ear: Tympanic membrane, ear canal and external ear normal.  Eyes:     Extraocular Movements: Extraocular movements intact.     Pupils: Pupils are equal, round, and reactive to light.  Cardiovascular:     Rate and Rhythm: Normal rate and regular rhythm.     Heart sounds: Normal heart sounds. No murmur heard.    No gallop.  Pulmonary:     Effort: Pulmonary effort is normal. No respiratory distress.     Breath sounds: Normal breath sounds. No wheezing or rales.  Abdominal:     General: Bowel sounds are normal. There is no distension.     Palpations: Abdomen is soft.     Tenderness: There is no abdominal tenderness. There is no guarding.  Musculoskeletal:        General: Normal range of motion.  Feet:     Comments: Hypertrophic toenails of bilateral great toes and bilateral small toes. Examination was limited by presence of toenail polish. Skin:    General: Skin is warm and dry.     Comments: Very dry skin noted of bilateral feet.  Neurological:     General: No focal deficit present.      Mental Status: She is alert and oriented to person, place, and time.  Psychiatric:        Mood and Affect: Mood normal.        Behavior: Behavior normal.     BP 107/67 (BP Location: Right Arm, Patient Position: Sitting, Cuff Size: Large)   Pulse 82   Temp 98 F (36.7 C) (Oral)   Resp 16   Ht 5' 2.3" (1.582 m)   Wt 195 lb (88.5 kg)   SpO2 100%   BMI 35.32 kg/m  Wt Readings from Last 3 Encounters:  08/22/22 195 lb (88.5 kg)  08/03/22 193 lb (87.5 kg)  07/12/22 193 lb (87.5 kg)  Assessment & Plan:   Problem List Items Addressed This Visit       Unprioritized   Tinea pedis of both feet    Will rx with lamisil as noted above.       Relevant Medications   terbinafine (LAMISIL) 250 MG tablet   Onychomycosis    New. Baseline LFT's reviewed.  Will initiate lamisil with plan to treat for a total of 12 weeks. She will return in 4 weeks for repeat LFT's.       Relevant Medications   terbinafine (LAMISIL) 250 MG tablet   Nicotine abuse - Primary    She is vaping and wants to quit. Will give her a trial of chantix. Discussed side effects including rare side effect of suicide ideation. She is advised to discontinue right away and let us know if she develops any symptoms of depression.       Labral tear of shoulder, right, subsequent encounter    She has an MRI scheduled and is working closely with sports medicine on this.         Meds ordered this encounter  Medications   Varenicline Tartrate, Starter, (CHANTIX STARTING MONTH PAK) 0.5 MG X 11 & 1 MG X 42 TBPK    Sig: Take one 0.5 mg tablet by mouth once daily for 3 days, then increase to one 0.5 mg tablet twice daily for 4 days, then increase to one 1 mg tablet twice daily.    Dispense:  53 each    Refill:  0    Order Specific Question:   Supervising Provider    Answer:   Danise Edge A [[id number]]   terbinafine (LAMISIL) 250 MG tablet    Sig: Take 1 tablet (250 mg total) by mouth daily.    Dispense:  30 tablet     Refill:  0    Order Specific Question:   Supervising Provider    Answer:   Danise Edge A [[id number]]    I, Lemont Fillers, NP, personally preformed the services described in this documentation.  All medical record entries made by the scribe were at my direction and in my presence.  I have reviewed the chart and discharge instructions (if applicable) and agree that the record reflects my personal performance and is accurate and complete. 08/22/2022.  I,Mathew Stumpf,acting as a Neurosurgeon for [employer], NP.,have documented all relevant documentation on the behalf of Lemont Fillers, NP,as directed by  Lemont Fillers, NP while in the presence of Lemont Fillers, NP.   Lemont Fillers, NP

## 2022-08-22 NOTE — Assessment & Plan Note (Addendum)
New. Baseline LFT's reviewed.  Will initiate lamisil with plan to treat for a total of 12 weeks. She will return in 4 weeks for repeat LFT's.

## 2022-08-27 ENCOUNTER — Other Ambulatory Visit: Payer: Medicaid Other

## 2022-09-11 ENCOUNTER — Encounter: Payer: Self-pay | Admitting: Family

## 2022-09-11 DIAGNOSIS — E611 Iron deficiency: Secondary | ICD-10-CM | POA: Insufficient documentation

## 2022-09-11 DIAGNOSIS — E559 Vitamin D deficiency, unspecified: Secondary | ICD-10-CM | POA: Insufficient documentation

## 2022-09-11 DIAGNOSIS — E785 Hyperlipidemia, unspecified: Secondary | ICD-10-CM | POA: Insufficient documentation

## 2022-09-11 DIAGNOSIS — G43109 Migraine with aura, not intractable, without status migrainosus: Secondary | ICD-10-CM | POA: Insufficient documentation

## 2022-09-11 DIAGNOSIS — B9681 Helicobacter pylori [H. pylori] as the cause of diseases classified elsewhere: Secondary | ICD-10-CM | POA: Insufficient documentation

## 2022-09-15 ENCOUNTER — Telehealth: Payer: Self-pay | Admitting: *Deleted

## 2022-09-15 MED ORDER — PREDNISONE 10 MG PO TABS: ORAL_TABLET | ORAL | 0 refills | Status: DC

## 2022-09-15 NOTE — Telephone Encounter (Signed)
-----   Message from Carl Best sent at 09/15/2022  9:46 AM EDT ----- Regarding: patient request Pain Rx Patient called states Shoulder pain has not improved & she ask for Provider to refill :   methylPREDNISolone (MEDROL DOSEPAK) 4 MG TBPK tablet [[id number]]  DISCONTINUED   Order Details  Dose, Route, Frequency: As Directed Dispense Quantity: 21 each Refills: 0   ----- Advised pt that it was ED doctor who Originally prescribed that Rx.  Pt says it's the only one that worked to relieve her pain, none of the Rxs works.  --Please see if provider approves PT request

## 2022-09-15 NOTE — Telephone Encounter (Signed)
Rx sent. See meds.  

## 2022-09-21 NOTE — Progress Notes (Signed)
Subjective:   By signing my name below, I, [name], attest that this documentation has been prepared under the direction and in the presence of Lemont Fillers, NP 09/22/22   Patient ID: Grace Marshall, female    DOB: 2001-01-23, 22 y.o.   MRN: [id number]  Chief Complaint  Patient presents with   Nicotine Dependence    Patient reports she had to discontinue chantix due to headaches   Tinea Pedis    Patient reports took medication for two weeks but had an incident and had to discard the rest of the pills.     HPI Patient is in today for a 1 month follow up.   Nicotine Dependence: She reports she had to stop taking Chantix after a week due to headaches. She previously would get migraines and while taking Chantix, her migraines became more frequent. She states ibuprofen did no help. Since stopping Chantix, she has still been able to limit her nicotine consumption. She has been vaping 3x a day (mornings, after lunch, and before bed). She notes cutting back on vaping is easier on the weekends. She is currently using a 5% concentration vape pen and is receptive to gradually lowering the percent concentration.   Athlete's foot: She was previously taking 250 mg Lamisil, which was helping. She has been unable to take it for 2 weeks following an incident where her medication was disposed.   Past Medical History:  Diagnosis Date   Helicobacter pylori gastritis    per EGD 10/05/20   Hyperlipidemia    Iron deficiency    Migraine with aura    Trichomoniasis 2022   Vitamin D deficiency     Past Surgical History:  Procedure Laterality Date   WISDOM TOOTH EXTRACTION  2022    Family History  Adopted: Yes  Problem Relation Age of Onset   Hypertension Father    Diabetes Father    Hypertension Paternal Grandmother    Diabetes Paternal Grandmother    Hypertension Paternal Grandfather    Diabetes Paternal Grandfather     Social History   Socioeconomic History   Marital  status: Single    Spouse name: Not on file   Number of children: 1   Years of education: Not on file   Highest education level: Not on file  Occupational History   Occupation: Pharmacologist   Occupation: Runner, broadcasting/film/video  Tobacco Use   Smoking status: Never   Smokeless tobacco: Never  Vaping Use   Vaping Use: Every day   Start date: 07/23/2020  Substance and Sexual Activity   Alcohol use: Yes    Comment: occasionaly   Drug use: Not Currently   Sexual activity: Not Currently    Birth control/protection: Inserts    Comment: nexplanon  Other Topics Concern   Not on file  Social History Narrative   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 21 tablet 0   Varenicline Tartrate, Starter, (CHANTIX STARTING MONTH PAK) 0.5 MG X 11 & 1 MG X 42 TBPK Take one 0.5 mg tablet by mouth once daily for 3 days, then increase to one 0.5 mg tablet twice daily for 4 days, then increase to one 1 mg tablet twice daily. 53 each 0   terbinafine (LAMISIL) 250 MG tablet Take 1 tablet (250 mg total) by mouth daily. 30 tablet 0   No facility-administered medications prior to visit.    No Known Allergies  ROS    See HPI Objective:    Physical Exam Constitutional:      General: She is not in acute distress.    Appearance: Normal appearance. She is well-developed.  HENT:     Head: Normocephalic and  atraumatic.     Right Ear: External ear normal.     Left Ear: External ear normal.  Eyes:     General: No scleral icterus. Neck:     Thyroid: No thyromegaly.  Cardiovascular:     Rate and Rhythm: Normal rate and regular rhythm.     Heart sounds: Normal heart sounds. No murmur heard. Pulmonary:     Effort: Pulmonary effort is normal. No respiratory distress.     Breath sounds: Normal breath sounds. No wheezing.  Musculoskeletal:     Cervical back: Neck supple.  Feet:     Comments: Bilateral toe nail discoloration noted.  Skin:    General: Skin is warm and dry.  Neurological:     Mental Status: She is alert and oriented to person, place, and time.  Psychiatric:        Mood and Affect: Mood normal.        Behavior: Behavior normal.        Thought Content: Thought content normal.        Judgment: Judgment normal.     BP 105/63 (BP Location: Right Arm, Patient Position: Sitting, Cuff Size: Large)   Pulse 91   Temp 97.9 F (36.6 C) (Oral)   Resp 16   Wt 195 lb (88.5 kg)   SpO2 100%   BMI 35.32 kg/m  Wt Readings from Last 3 Encounters:  09/22/22 195 lb (88.5 kg)  08/22/22 195 lb (88.5 kg)  08/03/22 193 lb (87.5 kg)       Assessment & Plan:  Nicotine abuse Assessment & Plan: Did not tolerate chantix. She will work on weaning down her vape concentration. Current vape concentration is 5% and she is using it 3 x a day which is less than she was.  Recommended that she decrease her concentration to 4% x 1 week, 3% x one week etc. Until she is weaned off completely.    Onychomycosis Assessment & Plan: Unchanged.  Will restart lamisil and repeat lft in 1 month.  If WNL, then we will plan to continue an additional 6 weeks of therapy to complete 12 weeks total.   Orders: -     Hepatic function panel; Future  Tinea pedis of both feet Assessment & Plan: Improved following 2 week rx of lamisil.    Other orders -     Terbinafine HCl; Take 1 tablet (250 mg total) by mouth  daily.  Dispense: 30 tablet; Refill: 0     I,Rachel Rivera,acting as a scribe for [employer], NP.,have  documented all relevant documentation on the behalf of Lemont Fillers, NP,as directed by  Lemont Fillers, NP while in the presence of Lemont Fillers, NP.   I, Lemont Fillers, NP, personally preformed the services described in this documentation.  All medical record entries made by the scribe were at my direction and in my presence.  I have reviewed the chart and discharge instructions (if applicable) and agree that the record reflects my personal performance and is accurate and complete. 09/22/22   Lemont Fillers, NP

## 2022-09-22 ENCOUNTER — Ambulatory Visit (INDEPENDENT_AMBULATORY_CARE_PROVIDER_SITE_OTHER): Payer: Medicaid Other | Admitting: Family

## 2022-09-22 ENCOUNTER — Other Ambulatory Visit (HOSPITAL_BASED_OUTPATIENT_CLINIC_OR_DEPARTMENT_OTHER): Payer: Self-pay

## 2022-09-22 VITALS — BP 105/63 | HR 91 | Temp 97.9°F | Resp 16 | Wt 195.0 lb

## 2022-09-22 DIAGNOSIS — B353 Tinea pedis: Secondary | ICD-10-CM

## 2022-09-22 DIAGNOSIS — Z720 Tobacco use: Secondary | ICD-10-CM

## 2022-09-22 DIAGNOSIS — B351 Tinea unguium: Secondary | ICD-10-CM

## 2022-09-22 MED ORDER — TERBINAFINE HCL 250 MG PO TABS
250.0000 mg | ORAL_TABLET | Freq: Every day | ORAL | 0 refills | Status: DC
  Filled 2022-09-22: qty 30, 30d supply, fill #0

## 2022-09-22 NOTE — Assessment & Plan Note (Addendum)
Did not tolerate chantix. She will work on weaning down her vape concentration. Current vape concentration is 5% and she is using it 3 x a day which is less than she was.  Recommended that she decrease her concentration to 4% x 1 week, 3% x one week etc. Until she is weaned off completely.

## 2022-09-22 NOTE — Assessment & Plan Note (Signed)
Improved following 2 week rx of lamisil.

## 2022-09-22 NOTE — Assessment & Plan Note (Signed)
Unchanged.  Will restart lamisil and repeat lft in 1 month.  If WNL, then we will plan to continue an additional 6 weeks of therapy to complete 12 weeks total.

## 2022-10-23 ENCOUNTER — Other Ambulatory Visit: Payer: Medicaid Other

## 2022-11-06 ENCOUNTER — Telehealth: Payer: Self-pay | Admitting: Family

## 2022-11-06 ENCOUNTER — Ambulatory Visit: Payer: Medicaid Other | Admitting: Family Medicine

## 2022-11-06 ENCOUNTER — Other Ambulatory Visit: Payer: Self-pay

## 2022-11-06 ENCOUNTER — Emergency Department (HOSPITAL_BASED_OUTPATIENT_CLINIC_OR_DEPARTMENT_OTHER)
Admission: EM | Admit: 2022-11-06 | Discharge: 2022-11-06 | Discharge status: Against Medical Advice | Payer: Medicaid Other | Attending: Emergency Medicine | Admitting: Emergency Medicine

## 2022-11-06 DIAGNOSIS — Z5321 Procedure and treatment not carried out due to patient leaving prior to being seen by health care provider: Secondary | ICD-10-CM | POA: Insufficient documentation

## 2022-11-06 DIAGNOSIS — R519 Headache, unspecified: Secondary | ICD-10-CM | POA: Insufficient documentation

## 2022-11-06 DIAGNOSIS — R112 Nausea with vomiting, unspecified: Secondary | ICD-10-CM | POA: Insufficient documentation

## 2022-11-06 NOTE — Telephone Encounter (Signed)
 Appt today w/ Dr. Etter Sjogren

## 2022-11-06 NOTE — Telephone Encounter (Signed)
FYI: This call has been transferred to triage nurse: the Triage Nurse. Once the result note has been entered staff can address the message at that time.  Patient called in with the following symptoms:  Red Word:loss of consciousness ( passed out) , dizziness , and headache   Please advise at Mobile [phone] (mobile)  Message is routed to Provider Pool.

## 2022-11-06 NOTE — Telephone Encounter (Signed)
Initial Comment Caller says she is having migraines. Translation No Nurse Assessment Nurse: Myles Rosenthal, RN, Katelyn Date/Time (Eastern Time): 11/06/2022 1:33:56 PM Confirm and document reason for call. If symptomatic, describe symptoms. ---Caller states she is having migraines. 9/10 rated for pain. Takes OTC medication with no relief. Does the patient have any new or worsening symptoms? ---Yes Will a triage be completed? ---Yes Related visit to physician within the last 2 weeks? ---No Does the PT have any chronic conditions? (i.e. diabetes, asthma, this includes High risk factors for pregnancy, etc.) ---Yes List chronic conditions. ---Migraines Is the patient pregnant or possibly pregnant? (Ask all females between the ages of 52-55) ---No Is this a behavioral health or substance abuse call? ---No Guidelines Guideline Title Affirmed Question Affirmed Notes Nurse Date/Time (Eastern Time) Headache [1] SEVERE headache (e.g., excruciating) AND [2] not improved after 2 hours of pain medicine Cruise, RN, Konrad Felix 11/06/2022 1:37:22 PM PLEASE NOTE: All timestamps contained within this report are represented as [place] Standard Time. CONFIDENTIALTY NOTICE: This fax transmission is intended only for the addressee. It contains information that is legally privileged, confidential or otherwise protected from use or disclosure. If you are not the intended recipient, you are strictly prohibited from reviewing, disclosing, copying using or disseminating any of this information or taking any action in reliance on or regarding this information. If you have received this fax in error, please notify us immediately by telephone so that we can arrange for its return to [place]. Phone: [phone], Toll-Free: [phone], Fax: [phone] Page: 2 of 2 Call Id: [id number] Disp. Time Lamount Cohen Time) Disposition Final User 11/06/2022 1:42:50 PM See HCP within 4 Hours (or PCP triage) Yes Cruise, RN, Katelyn Final  Disposition 11/06/2022 1:42:50 PM See HCP within 4 Hours (or PCP triage) Yes Cruise, RN, Konrad Felix Caller Disagree/Comply Comply Caller Understands Yes PreDisposition InappropriateToAsk Care Advice Given Per Guideline SEE HCP (OR PCP TRIAGE) WITHIN 4 HOURS: * IF OFFICE WILL BE OPEN: You need to be seen within the next 3 or 4 hours. Call your doctor (or NP/PA) now or as soon as the office opens. PAIN MEDICINES: * For pain relief, you can take either acetaminophen, ibuprofen, or naproxen. CALL BACK IF: * You become worse CARE ADVICE given per Headache (Adult) guideline. Comments User: Lasandra Beech, RN Date/Time Lamount Cohen Time): 11/06/2022 1:46:46 PM appt made today for 2:20pm Referrals REFERRED TO PCP OFFICE

## 2022-11-06 NOTE — ED Triage Notes (Signed)
Patient presents to ED via POV from PCP office. Here due to migraine since Thursday. Reports nausea and vomiting as well.

## 2022-11-06 NOTE — ED Notes (Signed)
This RN informed by registration that patient was seen leaving department.

## 2022-11-07 ENCOUNTER — Telehealth: Payer: Self-pay | Admitting: *Deleted

## 2022-11-07 ENCOUNTER — Ambulatory Visit: Payer: Medicaid Other | Admitting: Family Medicine

## 2022-11-07 ENCOUNTER — Encounter: Payer: Self-pay | Admitting: Family Medicine

## 2022-11-07 VITALS — BP 116/70 | HR 86 | Temp 98.6°F | Resp 12 | Ht 62.0 in | Wt 196.0 lb

## 2022-11-07 DIAGNOSIS — G43119 Migraine with aura, intractable, without status migrainosus: Secondary | ICD-10-CM | POA: Diagnosis not present

## 2022-11-07 DIAGNOSIS — N912 Amenorrhea, unspecified: Secondary | ICD-10-CM | POA: Diagnosis not present

## 2022-11-07 LAB — POCT URINE PREGNANCY: Preg Test, Ur: NEGATIVE

## 2022-11-07 MED ORDER — KETOROLAC TROMETHAMINE 60 MG/2ML IM SOLN
60.0000 mg | Freq: Once | INTRAMUSCULAR | Status: AC
Start: 2022-11-07 — End: 2022-11-07
  Administered 2022-11-07: 60 mg via INTRAMUSCULAR

## 2022-11-07 MED ORDER — RIZATRIPTAN BENZOATE 10 MG PO TBDP
10.0000 mg | ORAL_TABLET | ORAL | 0 refills | Status: DC | PRN
Start: 2022-11-07 — End: 2024-01-23

## 2022-11-07 NOTE — Progress Notes (Signed)
Established Patient Office Visit  Subjective   Patient ID: Grace Marshall, female    DOB: [birth year]  Age: 22 y.o. MRN: [id number]  Chief Complaint  Patient presents with   migraine since Thursday   wants pregnancy test    HPI Discussed the use of AI scribe software for clinical note transcription with the patient, who gave verbal consent to proceed.  History of Present Illness   The patient, with a history of migraines, presents with a slight headache. She reports experiencing migraines two to three times a month, with each episode lasting more than a day depending on the severity. The patient describes the onset of migraines as seeing spots and blurry vision, followed by severe pain in the temples. The pain is described as if someone is punching or the brain is thumping. The migraines are triggered by bright lights, bright colors, glare from the sun or cars, and the smell of vanilla. The patient has previously had a head scan and was given medication and fluids in the ER for a severe episode last year. The patient has tried various over-the-counter medications, including naproxen and sedrin, with no significant relief. The patient also mentions having an explenon implant for birth control, which is due for removal.      Patient Active Problem List   Diagnosis Date Noted   Vitamin D deficiency 09/11/2022   Migraine with aura 09/11/2022   Hyperlipidemia 09/11/2022   Helicobacter pylori gastritis 09/11/2022   Iron deficiency 09/11/2022   Nicotine abuse 08/22/2022   Tinea pedis of both feet 08/22/2022   Onychomycosis 08/22/2022   Labral tear of shoulder, right, subsequent encounter 06/23/2022   Past Medical History:  Diagnosis Date   Helicobacter pylori gastritis    per EGD 10/05/20   Hyperlipidemia    Iron deficiency    Migraine with aura    Trichomoniasis 2022   Vitamin D deficiency    Past Surgical History:  Procedure Laterality Date   WISDOM TOOTH EXTRACTION  2022    Social History   Tobacco Use   Smoking status: Never   Smokeless tobacco: Never  Vaping Use   Vaping status: Every Day   Start date: 07/23/2020  Substance Use Topics   Alcohol use: Yes    Comment: occasionaly   Drug use: Not Currently   Social History   Socioeconomic History   Marital status: Single    Spouse name: Not on file   Number of children: 1   Years of education: Not on file   Highest education level: Not on file  Occupational History   Occupation: Pharmacologist   Occupation: Runner, broadcasting/film/video  Tobacco Use   Smoking status: Never   Smokeless tobacco: Never  Vaping Use   Vaping status: Every Day   Start date: 07/23/2020  Substance and Sexual Activity   Alcohol use: Yes    Comment: occasionaly   Drug use: Not Currently   Sexual activity: Not Currently    Birth control/protection: Inserts    Comment: nexplanon  Other Topics Concern   Not on file  Social History Narrative   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   Has a 46 year old son   Lives with her Mom and her Son   Enjoys reading   Completed HS   Single   Social Determinants of Health   Financial Resource Strain: Not on file  Food Insecurity: Not on file  Transportation Needs: Not on file  Physical Activity: Not on file  Stress:  Not on file  Social Connections: Not on file  Intimate Partner Violence: Not on file   Family Status  Relation Name Status   Mother  Deceased   Father  Alive   Brother Trudee Kuster   MGM  Deceased   MGF  Deceased   PGM  Deceased   PGF  Deceased   Son  Alive   H Brother  Alive   H Brother  Alive   H Sister  Alive  No partnership data on file   Family History  Adopted: Yes  Problem Relation Age of Onset   Hypertension Father    Diabetes Father    Hypertension Paternal Grandmother    Diabetes Paternal Grandmother    Hypertension Paternal Grandfather    Diabetes Paternal Grandfather    No Known Allergies    Review of Systems  Constitutional:  Negative for fever and  malaise/fatigue.  HENT:  Negative for congestion.   Eyes:  Negative for blurred vision.  Respiratory:  Negative for shortness of breath.   Cardiovascular:  Negative for chest pain, palpitations and leg swelling.  Gastrointestinal:  Negative for abdominal pain, blood in stool and nausea.  Genitourinary:  Negative for dysuria and frequency.  Musculoskeletal:  Negative for falls.  Skin:  Negative for rash.  Neurological:  Positive for headaches. Negative for dizziness and loss of consciousness.  Endo/Heme/Allergies:  Negative for environmental allergies.  Psychiatric/Behavioral:  Negative for depression. The patient is not nervous/anxious.       Objective:     BP 116/70 (BP Location: Left Arm, Cuff Size: Large)   Pulse 86   Temp 98.6 F (37 C) (Oral)   Resp 12   Ht 5\' 2"  (1.575 m)   Wt 196 lb (88.9 kg)   LMP  (LMP Unknown)   SpO2 99%   BMI 35.85 kg/m  BP Readings from Last 3 Encounters:  11/07/22 116/70  11/06/22 117/77  09/22/22 105/63   Wt Readings from Last 3 Encounters:  11/07/22 196 lb (88.9 kg)  11/06/22 193 lb (87.5 kg)  09/22/22 195 lb (88.5 kg)   SpO2 Readings from Last 3 Encounters:  11/07/22 99%  11/06/22 100%  09/22/22 100%      Physical Exam Vitals and nursing note reviewed.  Constitutional:      General: She is not in acute distress.    Appearance: Normal appearance. She is well-developed.  HENT:     Head: Normocephalic and atraumatic.  Eyes:     General: No scleral icterus.       Right eye: No discharge.        Left eye: No discharge.  Cardiovascular:     Rate and Rhythm: Normal rate and regular rhythm.     Heart sounds: No murmur heard. Pulmonary:     Effort: Pulmonary effort is normal. No respiratory distress.     Breath sounds: Normal breath sounds.  Musculoskeletal:        General: No tenderness. Normal range of motion.     Cervical back: Normal, full passive range of motion without pain, normal range of motion and neck supple.      Right lower leg: No edema.     Left lower leg: No edema.  Skin:    General: Skin is warm and dry.  Neurological:     General: No focal deficit present.     Mental Status: She is alert and oriented to person, place, and time.  Psychiatric:  Mood and Affect: Mood normal.        Behavior: Behavior normal.        Thought Content: Thought content normal.        Judgment: Judgment normal.      Results for orders placed or performed in visit on 11/07/22  POCT urine pregnancy  Result Value Ref Range   Preg Test, Ur Negative Negative    Last CBC Lab Results  Component Value Date   WBC 6.4 07/05/2021   HGB 14.0 07/05/2021   HCT 41.7 07/05/2021   MCV 91.4 07/05/2021   MCH 30.7 07/05/2021   RDW 13.0 07/05/2021   PLT 319 07/05/2021   Last metabolic panel Lab Results  Component Value Date   GLUCOSE 83 07/05/2021   NA 136 07/05/2021   K 4.0 07/05/2021   CL 103 07/05/2021   CO2 25 07/05/2021   BUN 10 07/05/2021   CREATININE 0.75 07/05/2021   GFRNONAA >60 07/05/2021   CALCIUM 9.6 07/05/2021   PROT 8.5 (H) 07/05/2021   ALBUMIN 4.1 07/05/2021   BILITOT 0.5 07/05/2021   ALKPHOS 55 07/05/2021   AST 20 07/05/2021   ALT 23 07/05/2021   ANIONGAP 8 07/05/2021   Last lipids No results found for: "CHOL", "HDL", "LDLCALC", "LDLDIRECT", "TRIG", "CHOLHDL" Last hemoglobin A1c No results found for: "HGBA1C" Last thyroid functions No results found for: "TSH", "T3TOTAL", "T4TOTAL", "THYROIDAB" Last vitamin D No results found for: "25OHVITD2", "25OHVITD3", "VD25OH" Last vitamin B12 and Folate No results found for: "VITAMINB12", "FOLATE"    The ASCVD Risk score (Arnett DK, et al., 2019) failed to calculate for the following reasons:   The 2019 ASCVD risk score is only valid for ages 50 to 82    Assessment & Plan:   Problem List Items Addressed This Visit       Unprioritized   Migraine with aura - Primary    Toradol 60 mg I'm Maxalt Return to office if maxalt does not  work -- may need preventative  Ct head and cta reviewed       Relevant Medications   rizatriptan (MAXALT-MLT) 10 MG disintegrating tablet   Other Visit Diagnoses     Amenorrhea       Relevant Orders   POCT urine pregnancy (Completed)     Assessment and Plan    Migraine Headaches: Frequent migraines occurring 2-3 times per month, lasting more than a day, with visual aura. Triggers include bright lights, glare, and certain smells. Previous CT scan was normal. Current headache is mild. -Administer Toradol injection today for acute management. -Prescribe Maxalt (dissolvable tablets) for acute management, to be taken at onset of migraine symptoms. -Consider preventative therapy if Maxalt is ineffective. -Provide documentation of migraine diagnosis for employer.  Contraception: Nexplanon implant due for replacement. -Recommend scheduling appointment for Nexplanon removal and replacement.        No follow-ups on file.    Donato Schultz, DO

## 2022-11-07 NOTE — Telephone Encounter (Signed)
Left message on machine to check patient status.  Pt left ER after being triaged.

## 2022-11-07 NOTE — Telephone Encounter (Signed)
Pt scheduled for today again

## 2022-11-07 NOTE — Assessment & Plan Note (Signed)
Toradol 60 mg I'm Maxalt Return to office if maxalt does not work -- may need preventative  Ct head and cta reviewed

## 2022-11-12 ENCOUNTER — Ambulatory Visit
Admission: EM | Admit: 2022-11-12 | Discharge: 2022-11-12 | Disposition: A | Discharge status: Home / Self Care | Payer: Medicaid Other | Attending: Emergency Medicine | Admitting: Emergency Medicine

## 2022-11-12 DIAGNOSIS — H66001 Acute suppurative otitis media without spontaneous rupture of ear drum, right ear: Secondary | ICD-10-CM | POA: Diagnosis present

## 2022-11-12 DIAGNOSIS — J029 Acute pharyngitis, unspecified: Secondary | ICD-10-CM | POA: Insufficient documentation

## 2022-11-12 DIAGNOSIS — H6123 Impacted cerumen, bilateral: Secondary | ICD-10-CM

## 2022-11-12 DIAGNOSIS — Z20822 Contact with and (suspected) exposure to covid-19: Secondary | ICD-10-CM | POA: Insufficient documentation

## 2022-11-12 LAB — POCT RAPID STREP A (OFFICE): Rapid Strep A Screen: NEGATIVE

## 2022-11-12 MED ORDER — AMOXICILLIN-POT CLAVULANATE 875-125 MG PO TABS: 1.0000 | ORAL_TABLET | Freq: Two times a day (BID) | ORAL | 0 refills | Status: AC

## 2022-11-12 MED ORDER — FLUTICASONE PROPIONATE 50 MCG/ACT NA SUSP: 2.0000 | Freq: Every day | NASAL | 0 refills | Status: DC

## 2022-11-12 MED ORDER — DIPHENHYDRAMINE HCL 12.5 MG/5ML PO ELIX
12.5000 mg | ORAL_SOLUTION | Freq: Once | ORAL | Status: AC
  Administered 2022-11-12: 12.5 mg via ORAL

## 2022-11-12 MED ORDER — NAPROXEN 500 MG PO TABS: 500.0000 mg | ORAL_TABLET | Freq: Two times a day (BID) | ORAL | 0 refills | Status: DC

## 2022-11-12 MED ORDER — ALUM & MAG HYDROXIDE-SIMETH 200-200-20 MG/5ML PO SUSP
5.0000 mL | Freq: Once | ORAL | Status: AC
  Administered 2022-11-12: 5 mL via ORAL

## 2022-11-12 NOTE — ED Triage Notes (Addendum)
Pt c/o sore throat, HA, head congestion, runny nose, body aches x 2 days-no meds PTA-NAD-steady gait

## 2022-11-12 NOTE — ED Provider Notes (Signed)
HPI  SUBJECTIVE:  Grace Marshall is a 22 y.o. female who presents with right ear pain, sore throat, body aches, nasal congestion, green rhinorrhea, postnasal drip, nausea starting yesterday.  No fevers.  She reports a headache, but states that this is consistent with previous headaches/migraines.  No sinus pain or pressure, facial swelling, upper dental pain, cough, wheezing or shortness of breath, vomiting, diarrhea, abdominal pain, rash.  No drooling, trismus, neck stiffness, voice changes, sensation of throat swelling shut.  No known COVID or strep exposure.  She had 2 doses of the COVID-vaccine.  She is also reporting some sneezing and itchy eyes.  No antibiotics in the past month.  No antipyretic in past 6 hours.  She tried Tylenol and her migraine medications without improvement in her symptoms.  There are no aggravating or alleviating factors.  She has a past medical history of migraines.  LMP: 7/8.  Denies the possibility being pregnant.  PCP: [facility].   Past Medical History:  Diagnosis Date   Helicobacter pylori gastritis    per EGD 10/05/20   Hyperlipidemia    Iron deficiency    Migraine with aura    Trichomoniasis 2022   Vitamin D deficiency     Past Surgical History:  Procedure Laterality Date   WISDOM TOOTH EXTRACTION  2022    Family History  Adopted: Yes  Problem Relation Age of Onset   Hypertension Father    Diabetes Father    Hypertension Paternal Grandmother    Diabetes Paternal Grandmother    Hypertension Paternal Grandfather    Diabetes Paternal Grandfather     Social History   Tobacco Use   Smoking status: Never   Smokeless tobacco: Never  Vaping Use   Vaping status: Every Day   Start date: 07/23/2020  Substance Use Topics   Alcohol use: Yes    Comment: occasionaly   Drug use: Not Currently    No current facility-administered medications for this encounter.  Current Outpatient Medications:    amoxicillin-clavulanate (AUGMENTIN) 875-125 MG  tablet, Take 1 tablet by mouth every 12 (twelve) hours for 7 days., Disp: 14 tablet, Rfl: 0   fluticasone (FLONASE) 50 MCG/ACT nasal spray, Place 2 sprays into both nostrils daily., Disp: 16 g, Rfl: 0   naproxen (NAPROSYN) 500 MG tablet, Take 1 tablet (500 mg total) by mouth 2 (two) times daily., Disp: 20 tablet, Rfl: 0   rizatriptan (MAXALT-MLT) 10 MG disintegrating tablet, Take 1 tablet (10 mg total) by mouth as needed for migraine. May repeat in 2 hours if needed, Disp: 10 tablet, Rfl: 0  No Known Allergies   ROS  As noted in HPI.   Physical Exam  BP 95/68 (BP Location: Right Arm)   Pulse 96   Temp 99.5 F (37.5 C) (Oral)   Resp 16   LMP 10/30/2022   SpO2 97%   Constitutional: Well developed, well nourished, no acute distress Eyes:  EOMI, conjunctiva normal bilaterally HENT: Normocephalic, atraumatic,mucus membranes moist.  Bilateral cerumen impaction.  Positive nasal congestion.  Erythematous, swollen tonsils with scant exudates.  Erythematous oropharynx.  Uvula midline. Neck: Positive tender cervical lymphadenopathy Respiratory: Normal inspiratory effort, lungs clear bilaterally Cardiovascular: Normal rate, regular rhythm, no murmurs rubs or gallops GI: nondistended, soft, no splenomegaly skin: No rash, skin intact Musculoskeletal: no deformities Neurologic: Alert & oriented x 3, no focal neuro deficits Psychiatric: Speech and behavior appropriate   ED Course   Medications  diphenhydrAMINE (BENADRYL) 12.5 MG/5ML elixir 12.5 mg (12.5 mg Oral  Given 11/12/22 1258)  alum & mag hydroxide-simeth (MAALOX/MYLANTA) 200-200-20 MG/5ML suspension 5 mL (5 mLs Oral Given 11/12/22 1258)    Orders Placed This Encounter  Procedures   SARS CORONAVIRUS 2 (TAT 6-24 HRS) Anterior Nasal Swab    Standing Status:   Standing    Number of Occurrences:   1   Culture, group A strep    Standing Status:   Standing    Number of Occurrences:   1   Ear wax removal    Standing Status:   Standing     Number of Occurrences:   1   POCT rapid strep A    Standing Status:   Standing    Number of Occurrences:   1    Results for orders placed or performed during the hospital encounter of 11/12/22 (from the past 24 hour(s))  SARS CORONAVIRUS 2 (TAT 6-24 HRS) Anterior Nasal Swab     Status: None   Collection Time: 11/12/22 12:54 PM   Specimen: Anterior Nasal Swab  Result Value Ref Range   SARS Coronavirus 2 NEGATIVE NEGATIVE  POCT rapid strep A     Status: None   Collection Time: 11/12/22 12:57 PM  Result Value Ref Range   Rapid Strep A Screen Negative Negative   No results found.  ED Clinical Impression  1. Non-recurrent acute suppurative otitis media of right ear without spontaneous rupture of tympanic membrane   2. Encounter for laboratory testing for COVID-19 virus   3. Sore throat      ED Assessment/Plan     1.  Sore throat.  Checking strep, COVID.  Centor score 3.  Giving patient Benadryl/Maalox mixture here.  Patient unfortunately not a candidate for antivirals as she is otherwise young and healthy.  Supportive treatment.  With Naprosyn/Tylenol, Benadryl/Maalox, saline nasal irrigation, Flonase, Mucinex D  Strep negative.  Sent throat culture off.  Will extend Augmentin to 10 days if strep is positive.  COVID-negative.  2.  Cerumen impaction bilaterally, right ear pain.  Will have ears irrigated and reevaluate.  On reevaluation, patient states that she can hear.  Her right TM is dull, erythematous, but not bulging.  Will send home with 7 days of Augmentin to treat an ear infection.  Will extend it out to 10 days if strep is positive.  Discussed labs, MDM, treatment plan, and plan for follow-up with patient. Discussed sn/sx that should prompt return to the ED. patient agrees with plan.   Meds ordered this encounter  Medications   diphenhydrAMINE (BENADRYL) 12.5 MG/5ML elixir 12.5 mg   alum & mag hydroxide-simeth (MAALOX/MYLANTA) 200-200-20 MG/5ML suspension 5 mL    fluticasone (FLONASE) 50 MCG/ACT nasal spray    Sig: Place 2 sprays into both nostrils daily.    Dispense:  16 g    Refill:  0   naproxen (NAPROSYN) 500 MG tablet    Sig: Take 1 tablet (500 mg total) by mouth 2 (two) times daily.    Dispense:  20 tablet    Refill:  0   amoxicillin-clavulanate (AUGMENTIN) 875-125 MG tablet    Sig: Take 1 tablet by mouth every 12 (twelve) hours for 7 days.    Dispense:  14 tablet    Refill:  0      *This clinic note was created using Scientist, clinical (histocompatibility and immunogenetics). Therefore, there may be occasional mistakes despite careful proofreading.  ?    Domenick Gong, MD 11/13/22 1041

## 2022-11-12 NOTE — Discharge Instructions (Addendum)
Finish the antibiotics, even if you feel better.  We will extend that to 10 days if your throat culture comes back positive.  Flonase, saline nasal irrigation with a NeilMed sinus rinse and distilled water as often as you want, Mucinex D for the nasal congestion.  Naprosyn combined with Tylenol twice a day for the body aches.  COVID will be back in 6 to 24 hours.  your rapid strep was negative today, so we have sent off a throat culture.  We will contact you and call in the appropriate antibiotics if your culture comes back positive for an infection requiring antibiotic treatment.  Give [place] a working phone number.  If you were given a prescription for antibiotics, you may want to wait and fill it until you know the results of the culture.  1 gram of Tylenol and 500 mg of Naprosyn twice a day as needed for pain.  Make sure you drink plenty of extra fluids.  Some people find salt water gargles and  Traditional Medicinal's "Throat Coat" tea helpful. Take 5 mL of liquid Benadryl and 5 mL of Maalox. Mix it together, and then hold it in your mouth for as long as you can and then swallow. You may do this 4 times a day.  Honey and lemon dissolved in hot water can also be soothing.  Go to www.goodrx.com  or www.costplusdrugs.com to look up your medications. This will give you a list of where you can find your prescriptions at the most affordable prices. Or ask the pharmacist what the cash price is, or if they have any other discount programs available to help make your medication more affordable. This can be less expensive than what you would pay with insurance.

## 2022-11-13 LAB — SARS CORONAVIRUS 2 (TAT 6-24 HRS): SARS Coronavirus 2: NEGATIVE

## 2022-11-13 LAB — CULTURE, GROUP A STREP (THRC)

## 2022-11-14 LAB — CULTURE, GROUP A STREP (THRC)

## 2022-11-22 ENCOUNTER — Encounter (INDEPENDENT_AMBULATORY_CARE_PROVIDER_SITE_OTHER): Payer: Self-pay

## 2022-12-05 NOTE — Progress Notes (Unsigned)
      Established patient visit   Patient: Grace Marshall   DOB: 02/22/2001   22 y.o. Female  MRN: [id number] Visit Date: 12/06/2022  Today's healthcare provider: Alfredia Ferguson, PA-C   No chief complaint on file.  Subjective    HPI  ***  Medications: Outpatient Medications Prior to Visit  Medication Sig   fluticasone (FLONASE) 50 MCG/ACT nasal spray Place 2 sprays into both nostrils daily.   naproxen (NAPROSYN) 500 MG tablet Take 1 tablet (500 mg total) by mouth 2 (two) times daily.   rizatriptan (MAXALT-MLT) 10 MG disintegrating tablet Take 1 tablet (10 mg total) by mouth as needed for migraine. May repeat in 2 hours if needed   No facility-administered medications prior to visit.    Review of Systems {Insert previous labs (optional):23779} {See past labs  Heme  Chem  Endocrine  Serology  Results Review (optional):1}   Objective    LMP 10/30/2022  {Insert last BP/Wt (optional):23777}{See vitals history (optional):1}  Physical Exam  ***  No results found for any visits on 12/06/22.  Assessment & Plan     ***  No follow-ups on file.      {provider attestation***:1}   Alfredia Ferguson, PA-C  [facility] [phone] (phone) [phone] (fax)  [facility]

## 2022-12-06 ENCOUNTER — Encounter: Payer: Self-pay | Admitting: Physician Assistant

## 2022-12-06 ENCOUNTER — Ambulatory Visit (INDEPENDENT_AMBULATORY_CARE_PROVIDER_SITE_OTHER): Payer: Medicaid Other | Admitting: Physician Assistant

## 2022-12-06 VITALS — BP 120/68 | HR 98 | Temp 99.1°F | Wt 200.0 lb

## 2022-12-06 DIAGNOSIS — J029 Acute pharyngitis, unspecified: Secondary | ICD-10-CM | POA: Diagnosis not present

## 2022-12-06 DIAGNOSIS — J020 Streptococcal pharyngitis: Secondary | ICD-10-CM | POA: Diagnosis not present

## 2022-12-06 LAB — POCT RAPID STREP A (OFFICE): Rapid Strep A Screen: POSITIVE — AB

## 2022-12-06 MED ORDER — AMOXICILLIN 875 MG PO TABS
875.0000 mg | ORAL_TABLET | Freq: Two times a day (BID) | ORAL | 0 refills | Status: DC
Start: 2022-12-06 — End: 2022-12-06

## 2022-12-06 MED ORDER — AMOXICILLIN 500 MG PO CAPS
500.0000 mg | ORAL_CAPSULE | Freq: Two times a day (BID) | ORAL | 0 refills | Status: AC
Start: 2022-12-06 — End: 2022-12-16

## 2022-12-06 MED ORDER — PREDNISONE 20 MG PO TABS: 20.0000 mg | ORAL_TABLET | Freq: Every day | ORAL | 0 refills | Status: DC

## 2022-12-22 ENCOUNTER — Ambulatory Visit: Payer: Medicaid Other | Admitting: Family

## 2023-01-16 ENCOUNTER — Other Ambulatory Visit (HOSPITAL_BASED_OUTPATIENT_CLINIC_OR_DEPARTMENT_OTHER): Payer: Self-pay

## 2023-01-16 ENCOUNTER — Ambulatory Visit: Payer: Medicaid Other | Admitting: Family

## 2023-01-16 VITALS — BP 114/89 | HR 89 | Temp 98.8°F | Resp 16 | Wt 201.0 lb

## 2023-01-16 DIAGNOSIS — Z5181 Encounter for therapeutic drug level monitoring: Secondary | ICD-10-CM | POA: Diagnosis not present

## 2023-01-16 DIAGNOSIS — Z23 Encounter for immunization: Secondary | ICD-10-CM

## 2023-01-16 DIAGNOSIS — Z3046 Encounter for surveillance of implantable subdermal contraceptive: Secondary | ICD-10-CM | POA: Diagnosis not present

## 2023-01-16 DIAGNOSIS — B353 Tinea pedis: Secondary | ICD-10-CM

## 2023-01-16 MED ORDER — TERBINAFINE HCL 250 MG PO TABS: 250.0000 mg | ORAL_TABLET | Freq: Every day | ORAL | 0 refills | Status: DC

## 2023-01-16 MED ORDER — TERBINAFINE HCL 250 MG PO TABS
250.0000 mg | ORAL_TABLET | Freq: Every day | ORAL | 0 refills | Status: DC
  Filled 2023-01-16 (×2): qty 30, 30d supply, fill #0

## 2023-01-16 NOTE — Progress Notes (Unsigned)
Subjective:     Patient ID: Grace Marshall, female    DOB: 14-Feb-2001, 22 y.o.   MRN: [id number]  Chief Complaint  Patient presents with   Contraception    On nexplanon, will like to get it replaced.   Tinea Pedis    Here for follow up    HPI  Discussed the use of AI scribe software for clinical note transcription with the patient, who gave verbal consent to proceed.  History of Present Illness   The patient, with a history of Nexplanon contraceptive use and toenail fungus, presents for a routine visit. She reports that her Nexplanon has been in place for almost four years, and she expresses interest in having it replaced. She has not been seeing a gynecologist regularly.  Regarding her toenail fungus, she has completed a 1 month course of Lamisil, which has shown some improvement, but she still notices some darkness in her toenails. She was due for a follow-up in August for LFT check but was unable to attend due to her child having a severe allergic reaction. She is interested in continuing treatment for the toenail fungus.  The patient also mentions that she has never had a Pap smear before and is due for one. She is not currently sexually active but understands the need for condoms as a backup contraceptive method and for STD prevention until her Nexplanon is replaced.          Health Maintenance Due  Topic Date Due   HPV VACCINES (1 - 3-dose series) Never done   HIV Screening  Never done   Hepatitis C Screening  Never done   Cervical Cancer Screening (Pap smear)  Never done   DTaP/Tdap/Td (8 - Td or Tdap) 01/15/2022   COVID-19 Vaccine (2 - 2023-24 season) 12/24/2022    Past Medical History:  Diagnosis Date   Helicobacter pylori gastritis    per EGD 10/05/20   Hyperlipidemia    Iron deficiency    Migraine with aura    Trichomoniasis 2022   Vitamin D deficiency     Past Surgical History:  Procedure Laterality Date   WISDOM TOOTH EXTRACTION  2022    Family  History  Adopted: Yes  Problem Relation Age of Onset   Hypertension Father    Diabetes Father    Hypertension Paternal Grandmother    Diabetes Paternal Grandmother    Hypertension Paternal Grandfather    Diabetes Paternal Grandfather     Social History   Socioeconomic History   Marital status: Single    Spouse name: Not on file   Number of children: 1   Years of education: Not on file   Highest education level: Not on file  Occupational History   Occupation: Pharmacologist   Occupation: Runner, broadcasting/film/video  Tobacco Use   Smoking status: Never   Smokeless tobacco: Never  Vaping Use   Vaping status: Every Day   Start date: 07/23/2020  Substance and Sexual Activity   Alcohol use: Yes    Comment: occasionaly   Drug use: Not Currently   Sexual activity: Not Currently    Birth control/protection: Inserts    Comment: nexplanon  Other Topics Concern   Not on file  Social History Narrative   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 5 tablet 0   rizatriptan (MAXALT-MLT) 10 MG disintegrating tablet Take 1 tablet (10 mg total) by mouth as needed for migraine. May repeat in 2 hours if needed 10 tablet 0   No facility-administered medications prior to visit.    No Known Allergies  ROS    See HPI Objective:    Physical Exam Constitutional:      General: She is not in acute distress.    Appearance: Normal appearance. She is well-developed.  HENT:     Head:  Normocephalic and atraumatic.     Right Ear: External ear normal.     Left Ear: External ear normal.  Eyes:     General: No scleral icterus. Neck:     Thyroid: No thyromegaly.  Cardiovascular:     Rate and Rhythm: Normal rate and regular rhythm.     Heart sounds: Normal heart sounds. No murmur heard. Pulmonary:     Effort: Pulmonary effort is normal. No respiratory distress.     Breath sounds: Normal breath sounds. No wheezing.  Musculoskeletal:     Cervical back: Neck supple.  Skin:    General: Skin is warm and dry.     Comments: Left great toenail is thickened and discolored with some improved growth at the base of the toenail  Left small toenail thickened and discolored  Neurological:     Mental Status: She is alert and oriented to person, place, and time.  Psychiatric:        Mood and Affect: Mood normal.        Behavior: Behavior normal.        Thought Content: Thought content normal.        Judgment: Judgment normal.      BP 114/89 (BP Location: Left Arm, Patient Position: Sitting, Cuff Size: Large)   Pulse 89   Temp 98.8 F (37.1 C) (Oral)   Resp 16   Wt 201 lb (91.2 kg)   SpO2 99%   BMI 36.76 kg/m  Wt Readings from Last 3 Encounters:  01/16/23 201 lb (91.2 kg)  12/06/22 200 lb (90.7 kg)  11/07/22 196 lb (88.9 kg)       Assessment & Plan:   Problem List Items Addressed This Visit       Unprioritized   Tinea pedis of both feet    Some improvement in the new growth of left great toenail. Repeat LFT today and again in 1 month.  Resume lamisil for an addition 8 weeks to complete 12 week course.       Relevant Medications   terbinafine (LAMISIL) 250 MG tablet   Encounter for surveillance of Nexplanon subdermal contraceptive    Nexplanon expired after 3 years. Currently not sexually active but advised to use condoms as a backup contraceptive method and for STD prevention until Nexplanon is replaced. -Plan to replace Nexplanon in 4 weeks.      Other  Visit Diagnoses     Therapeutic drug monitoring    -  Primary   Relevant Orders   Hepatic function panel   Needs flu  shot       Relevant Orders   Flu vaccine trivalent PF, 6mos and older(Flulaval,Afluria,Fluarix,Fluzone) (Completed)      General Health Maintenance   -Administer influenza vaccine today. -Schedule first Pap smear in 6 weeks. -Physical exam to be done during the same visit as the Pap smear. I am having Grace Marshall maintain her rizatriptan, predniSONE, and terbinafine.  Meds ordered this encounter  Medications   DISCONTD: terbinafine (LAMISIL) 250 MG tablet    Sig: Take 1 tablet (250 mg total) by mouth daily.    Dispense:  30 tablet    Refill:  0    Order Specific Question:   Supervising Provider    Answer:   Danise Edge A [[id number]]   terbinafine (LAMISIL) 250 MG tablet    Sig: Take 1 tablet (250 mg total) by mouth daily.    Dispense:  30 tablet    Refill:  0    Order Specific Question:   Supervising Provider    Answer:   Danise Edge A [[id number]]

## 2023-01-17 DIAGNOSIS — Z3046 Encounter for surveillance of implantable subdermal contraceptive: Secondary | ICD-10-CM | POA: Insufficient documentation

## 2023-01-17 NOTE — Assessment & Plan Note (Signed)
Some improvement in the new growth of left great toenail. Repeat LFT today and again in 1 month.  Resume lamisil for an addition 8 weeks to complete 12 week course.

## 2023-01-17 NOTE — Assessment & Plan Note (Signed)
Nexplanon expired after 3 years. Currently not sexually active but advised to use condoms as a backup contraceptive method and for STD prevention until Nexplanon is replaced. -Plan to replace Nexplanon in 4 weeks.

## 2023-02-14 ENCOUNTER — Ambulatory Visit: Payer: Medicaid Other | Admitting: Family

## 2023-02-14 VITALS — BP 109/66 | HR 95 | Temp 98.4°F | Resp 16 | Wt 202.0 lb

## 2023-02-14 DIAGNOSIS — Z3046 Encounter for surveillance of implantable subdermal contraceptive: Secondary | ICD-10-CM

## 2023-02-14 DIAGNOSIS — Z3040 Encounter for surveillance of contraceptives, unspecified: Secondary | ICD-10-CM

## 2023-02-14 LAB — POCT URINE PREGNANCY: Preg Test, Ur: NEGATIVE

## 2023-02-14 MED ORDER — NEXPLANON 68 MG ~~LOC~~ IMPL: 1.0000 | DRUG_IMPLANT | Freq: Once | SUBCUTANEOUS | Status: AC

## 2023-02-14 NOTE — Patient Instructions (Signed)
Please keep area clean and dry for 24 hours. You may use tylenol or motrin as needed for pain. Call if you develop redness/drainage from the site.

## 2023-02-14 NOTE — Progress Notes (Signed)
ssssssssssssssssss  Subjective:     Patient ID: Grace Marshall, female    DOB: Sep 18, 2000, 22 y.o.   MRN: [id number]  No chief complaint on file.   HPI  Discussed the use of AI scribe software for clinical note transcription with the patient, who gave verbal consent to proceed.  History of Present Illness         Patient presents today for nexplanon placement and nexplanon removal.  Her current nexplanon has been in place for almost 4 years.  Her LMP was 3 days ago.  HCG is negative.    Health Maintenance Due  Topic Date Due   HPV VACCINES (1 - 3-dose series) Never done   HIV Screening  Never done   Hepatitis C Screening  Never done   Cervical Cancer Screening (Pap smear)  Never done   DTaP/Tdap/Td (8 - Td or Tdap) 01/15/2022   COVID-19 Vaccine (2 - 2023-24 season) 12/24/2022    Past Medical History:  Diagnosis Date   Helicobacter pylori gastritis    per EGD 10/05/20   Hyperlipidemia    Iron deficiency    Migraine with aura    Trichomoniasis 2022   Vitamin D deficiency     Past Surgical History:  Procedure Laterality Date   WISDOM TOOTH EXTRACTION  2022    Family History  Adopted: Yes  Problem Relation Age of Onset   Hypertension Father    Diabetes Father    Hypertension Paternal Grandmother    Diabetes Paternal Grandmother    Hypertension Paternal Grandfather    Diabetes Paternal Grandfather     Social History   Socioeconomic History   Marital status: Single    Spouse name: Not on file   Number of children: 1   Years of education: Not on file   Highest education level: Not on file  Occupational History   Occupation: Pharmacologist   Occupation: Runner, broadcasting/film/video  Tobacco Use   Smoking status: Never   Smokeless tobacco: Never  Vaping Use   Vaping status: Every Day   Start date: 07/23/2020  Substance and Sexual Activity   Alcohol use: Yes    Comment: occasionaly   Drug use: Not Currently   Sexual activity: Not Currently    Birth control/protection: Inserts     Comment: nexplanon  Other Topics Concern   Not on file  Social History Narrative   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 5 tablet 0   rizatriptan (MAXALT-MLT) 10 MG disintegrating tablet Take 1 tablet (10 mg total) by mouth as needed for migraine. May repeat in 2 hours if needed 10 tablet 0   terbinafine (LAMISIL) 250 MG tablet Take 1 tablet (250 mg total) by mouth daily. 30 tablet 0   No facility-administered medications prior to visit.    No Known Allergies  ROS    See HPI Objective:  Physical Exam Constitutional:      Appearance: Normal appearance.  Skin:    General: Skin is warm and dry.  Neurological:     Mental Status: She is alert and oriented to person, place, and time.  Psychiatric:        Mood and Affect: Mood normal.        Behavior: Behavior normal.        Thought Content: Thought content normal.        Judgment: Judgment normal.      BP 109/66 (BP Location: Left Arm, Patient Position: Sitting, Cuff Size: Large)   Pulse 95   Temp 98.4 F (36.9 C) (Oral)   Resp 16   Wt 202 lb (91.6 kg)   SpO2 99%   BMI 36.95 kg/m  Wt Readings from Last 3 Encounters:  02/14/23 202 lb (91.6 kg)  01/16/23 201 lb (91.2 kg)  12/06/22 200 lb (90.7 kg)  The risks and benefits of Nexplanon removal was discussed with the patient who has elected to proceed with procedure.  A signed consent was obtained and placed in the medical chart.  The  patient denies any allergies to anesthetics or antiseptics.  Procedure: Patient was placed in supine position. The device was palpated and marked.  Her right arm was flexed at the elbow and externally rotated so that her wrist was parallel to her ear. The site was cleaned with Betadine, and the area surrounding the device was covered with a sterile drape. 1 cc of 1% lidocaine with epinephrine was injected just under the device. A scalpel was used to create a small incision.  Fibrous tissue surrounding the device was carefully removed allowing the device to be freed. The device was removed, measured and demonstrated to patient. Hemostasis achieved.  There was less than 5cc of blood loss.    Nexplanon Insertion  The patient denies any allergies to anesthetics or antiseptics.  The risks & benefits of Nexplanon insertion/use were discussed with the patient, who has elected to proceed with placement. Packaging instructions/ card supplied to patient.  A signed consent was obtained and was placed in patient's medical chart.  Procedure: 1% lidocaine with epinephrine was injected just under the skin at the insertion site extending 4 cm proximally. The sterile preloaded disposable Nexaplanon applicator was removed from the sterile packaging and the applicator needle was inserted at a 30 degree angle. The applicator was lowered to a horizontal position and advanced just under the skin for the full length of the needle. The slider of the applicator was retracted fully while the applicator remained in the same position.  The applicator was then removed.  Hemostasis was confirmed.  Position of the Nexplanon device was confirmed by palpation and was demonstrated to the patient.   A steri strip was applied to the incision site and site was wrapped in a bandage.    There were no immediate complications.  Lot# [id number]         [id number]      Assessment & Plan:   Problem List Items Addressed This Visit        Unprioritized   Encounter for removal and reinsertion of Nexplanon    Urine HCG negative. Nexplanon successfully removed and replaced. Pt tolerated the procedure well.       Other Visit Diagnoses     Encounter for surveillance of contraceptives, unspecified contraceptive    -  Primary   Relevant Orders   POCT urine pregnancy

## 2023-02-14 NOTE — Assessment & Plan Note (Signed)
Urine HCG negative. Nexplanon successfully removed and replaced. Pt tolerated the procedure well.

## 2023-02-19 MED ORDER — ETONOGESTREL 68 MG ~~LOC~~ IMPL
68.0000 mg | DRUG_IMPLANT | Freq: Once | SUBCUTANEOUS | Status: AC
Start: 2023-02-19 — End: ?

## 2023-02-19 NOTE — Addendum Note (Signed)
Addended by: Sandford Craze on: 02/19/2023 11:11 AM   Modules accepted: Orders

## 2023-02-28 ENCOUNTER — Encounter: Payer: Medicaid Other | Admitting: Family

## 2023-03-12 IMAGING — CT CT HEAD W/O CM
3 series · 16 of 47 positions shown, 19 images · non-contrast
Comparison: None.

CLINICAL DATA: Severe headaches



[Series 2: head wo · axial · 0.39mm/px · z∈[-182,-47]mm · 10 of 33 slices shown, 13 images]
[im 3/33  brain]
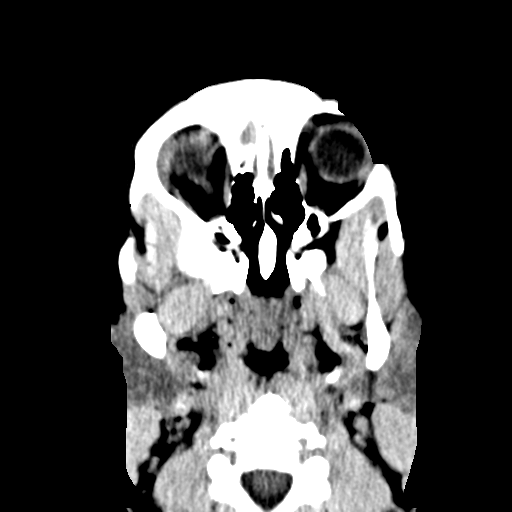
[im 3/33  bone]
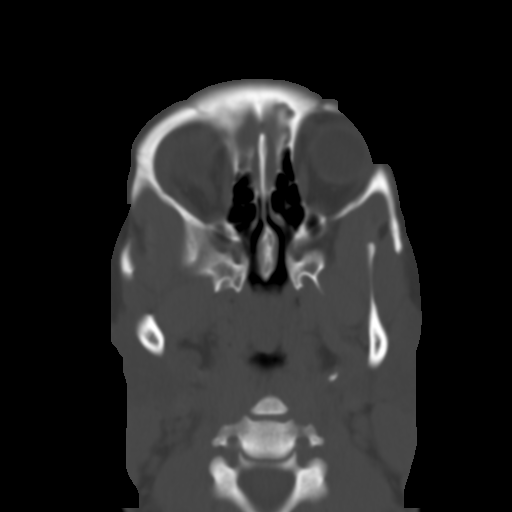
[im 6/33  brain]
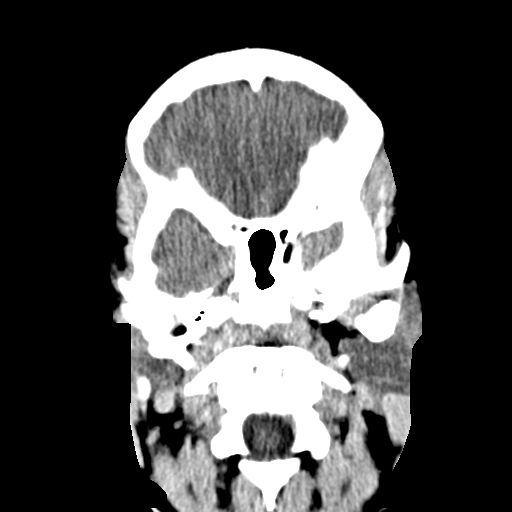
[im 9/33  brain]
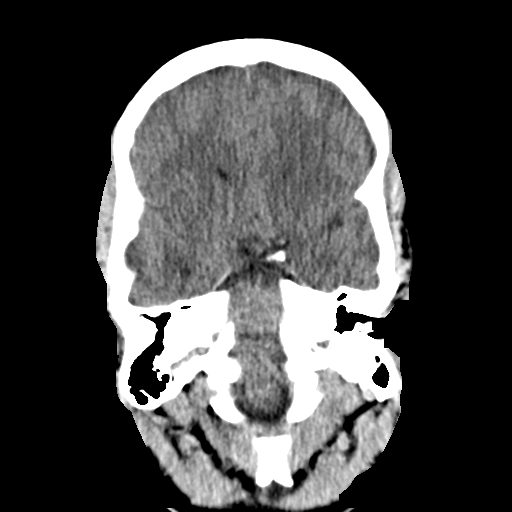
[im 12/33  brain]
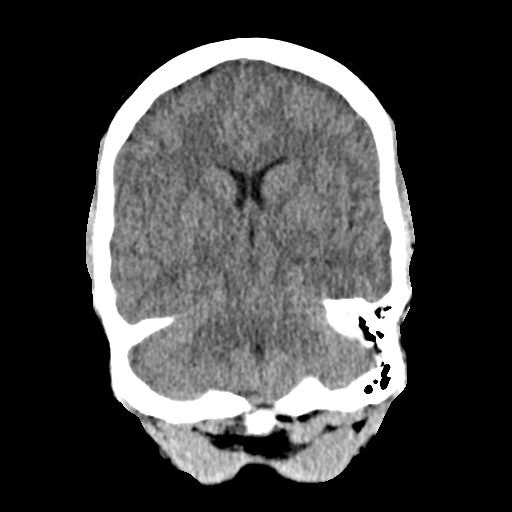
[im 15/33  brain]
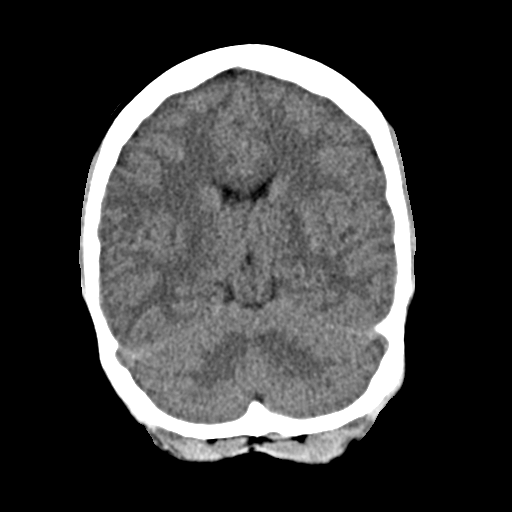
[im 15/33  bone]
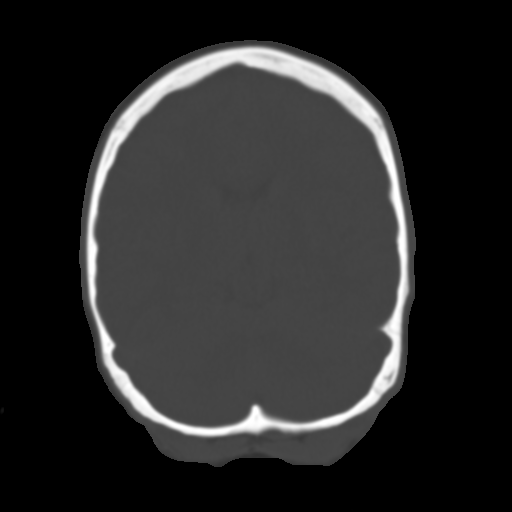
[im 18/33  brain]
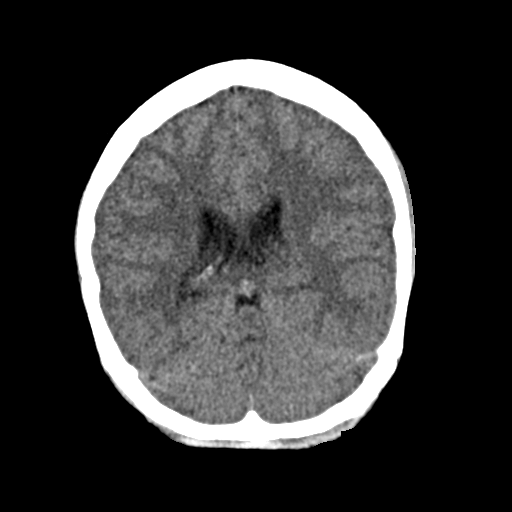
[im 21/33  brain]
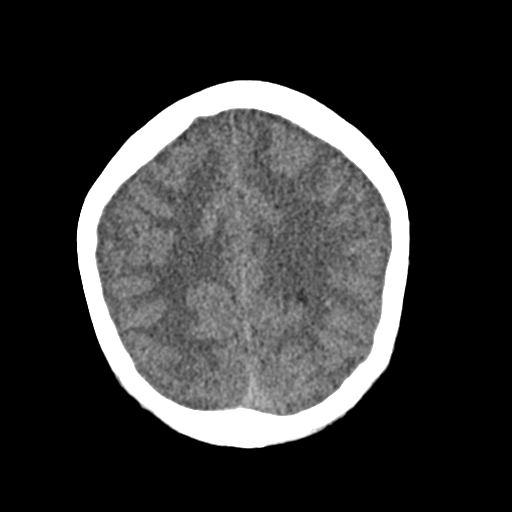
[im 25/33  brain]
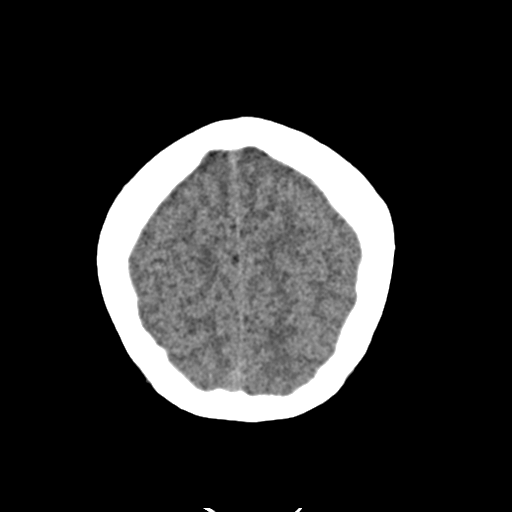
[im 27/33  brain]
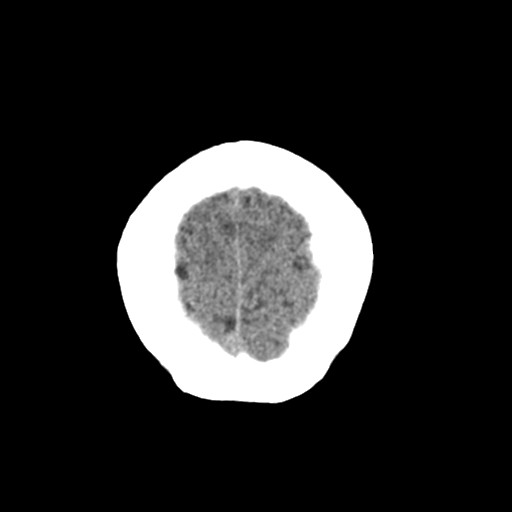
[im 27/33  bone]
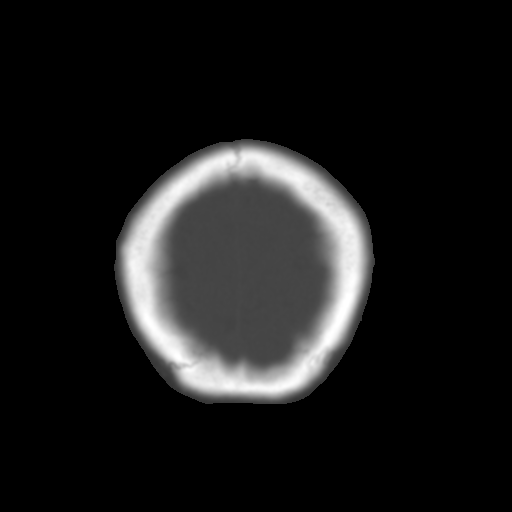
[im 30/33  brain]
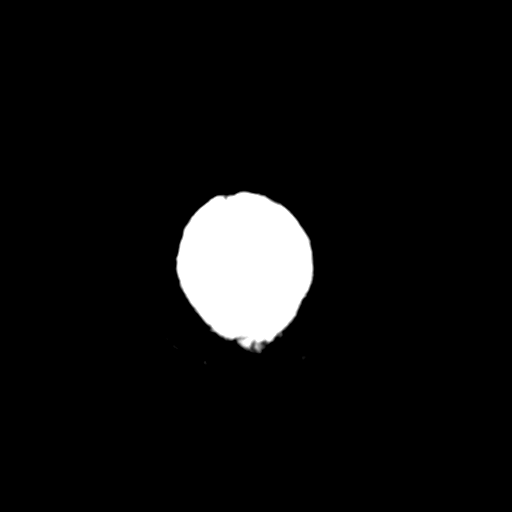

[Series 4: coronal soft · coronal · 0.30mm/px · 3 of 59 slices shown]
[im 20/59  brain]
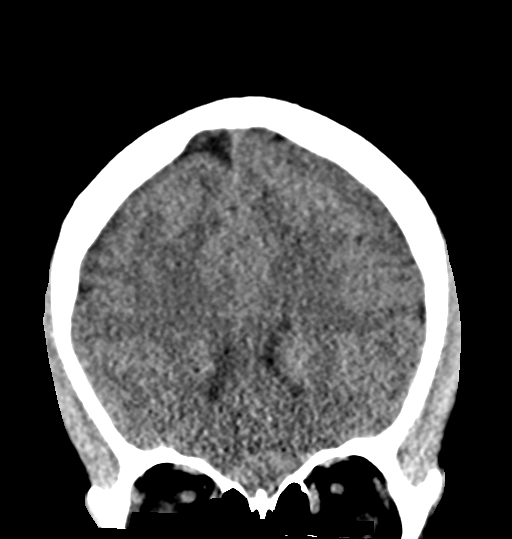
[im 26/59  brain]
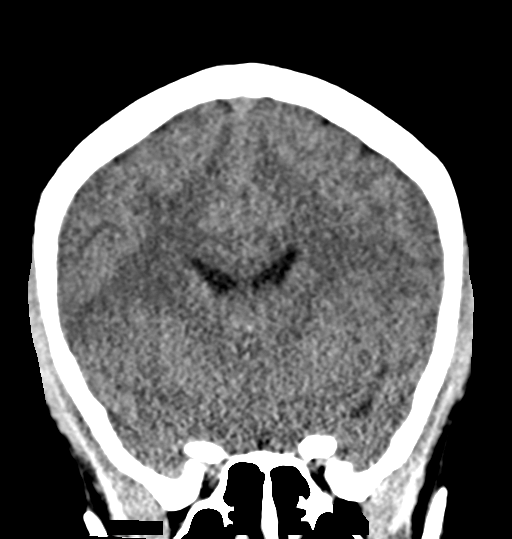
[im 33/59  brain]
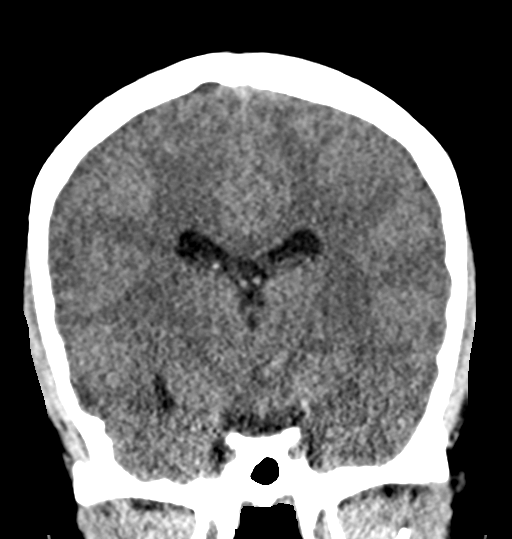

[Series 5: sag soft · sagittal · 0.33mm/px · 3 of 53 slices shown]
[im 18/53  brain]
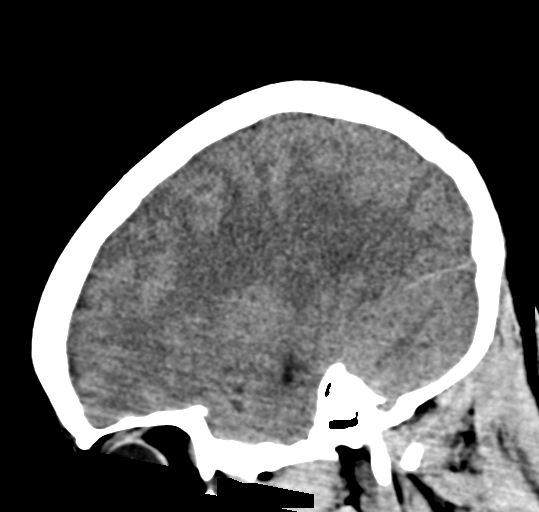
[im 27/53  brain]
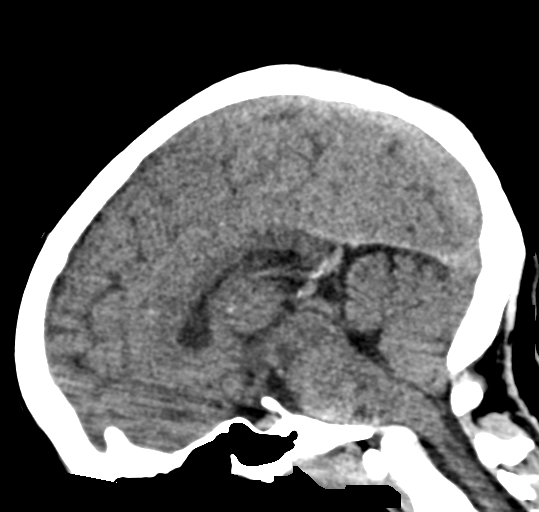
[im 35/53  brain]
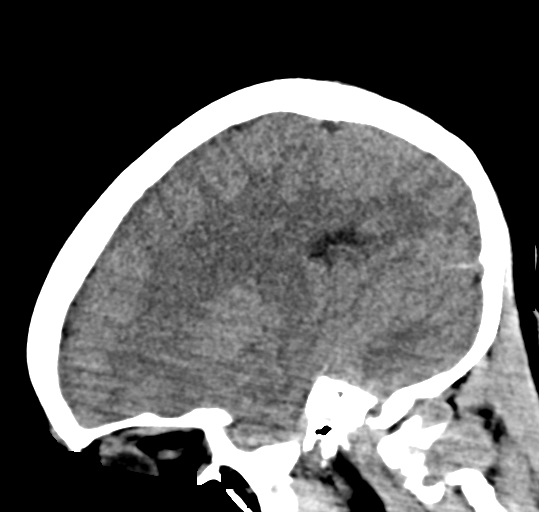

[16 of 47 positions shown; findings below may reference images not displayed]

FINDINGS: Brain: No acute intracranial findings are seen. Ventricles are not
dilated. There is no shift of midline structures. There are no
epidural or subdural fluid collections.

Vascular: Unremarkable.

Skull: Unremarkable.

Sinuses/Orbits: Unremarkable.

Other: None
IMPRESSION: No acute intracranial findings are seen in noncontrast CT brain.

## 2023-03-12 IMAGING — CR DG CHEST 2V
2 series · 2 of 2 positions shown · non-contrast
Comparison: None.

CLINICAL DATA: Chest pain.  Shortness of breath.

EXAM:
CHEST - 2 VIEW

[w chest pa]
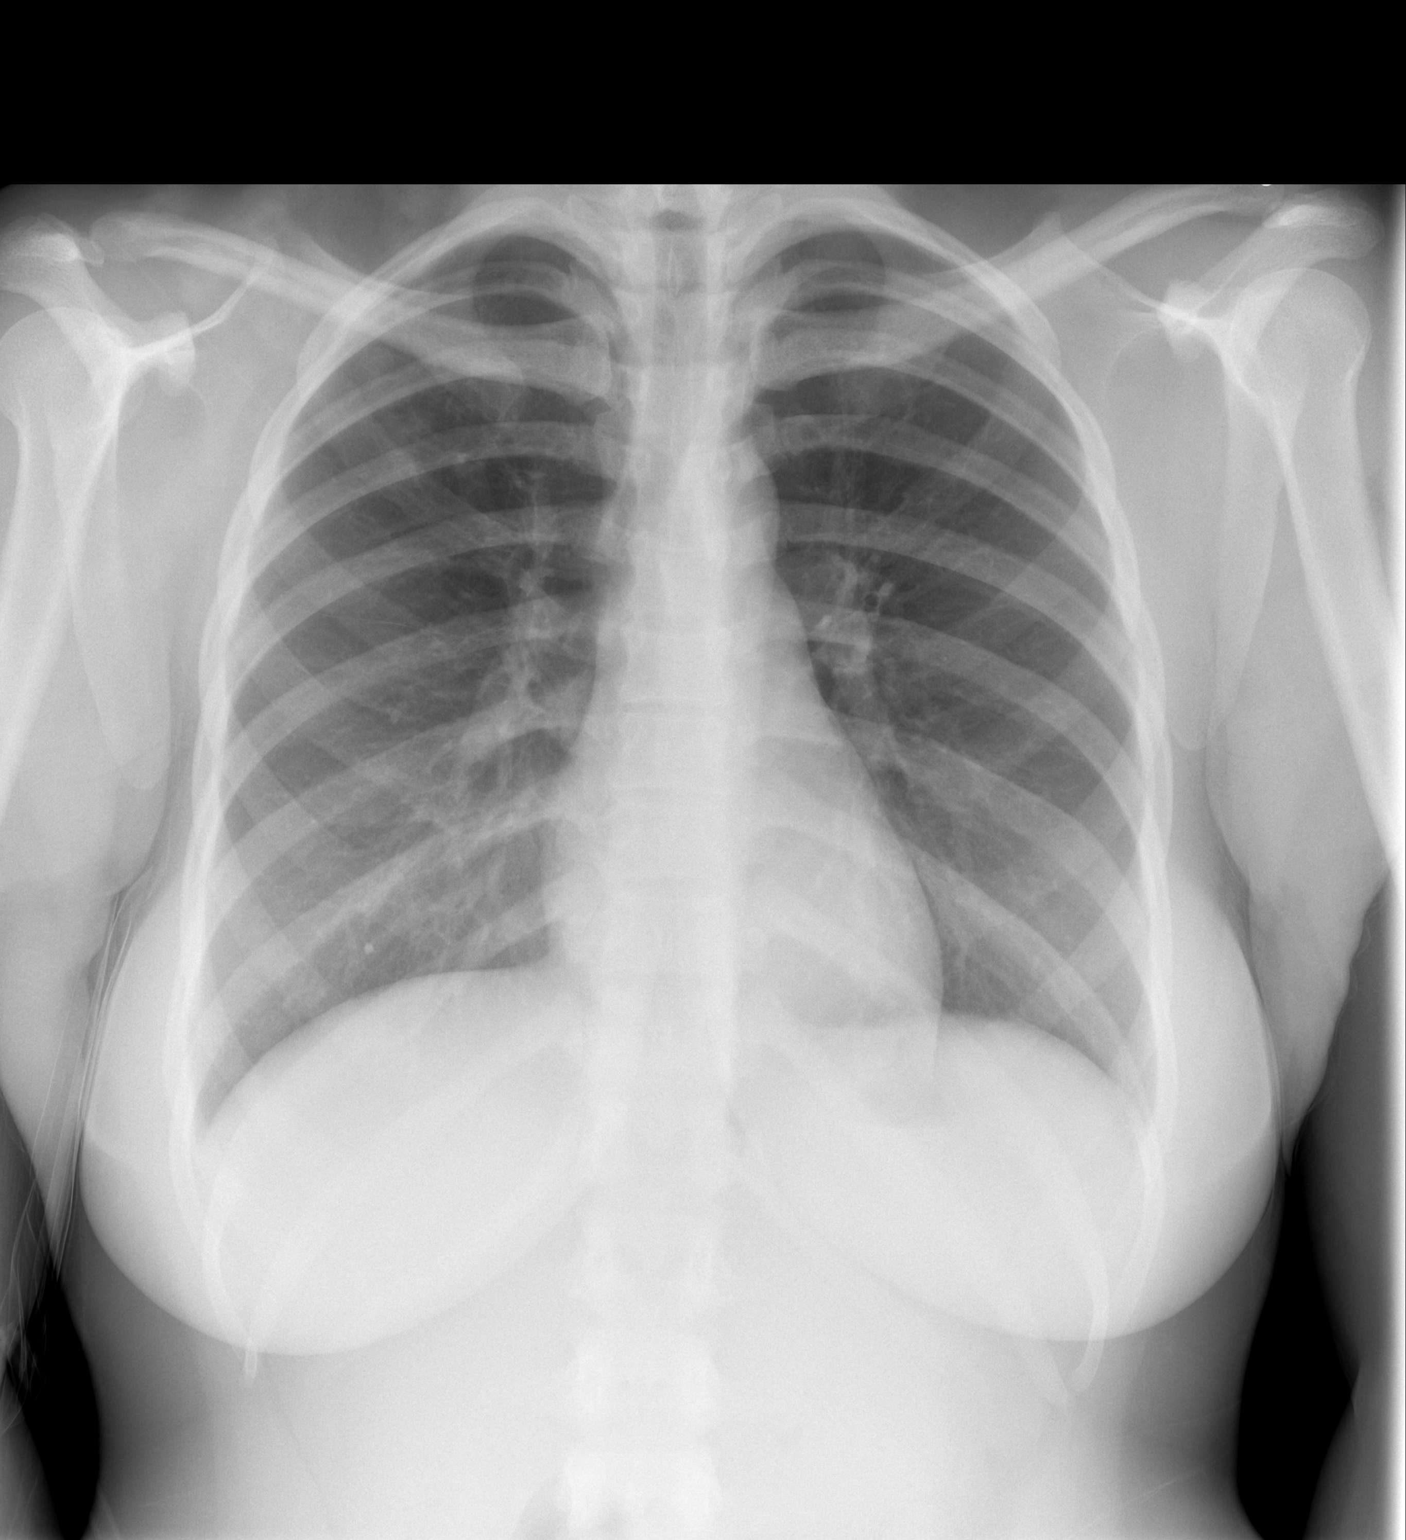

[w chest lat]
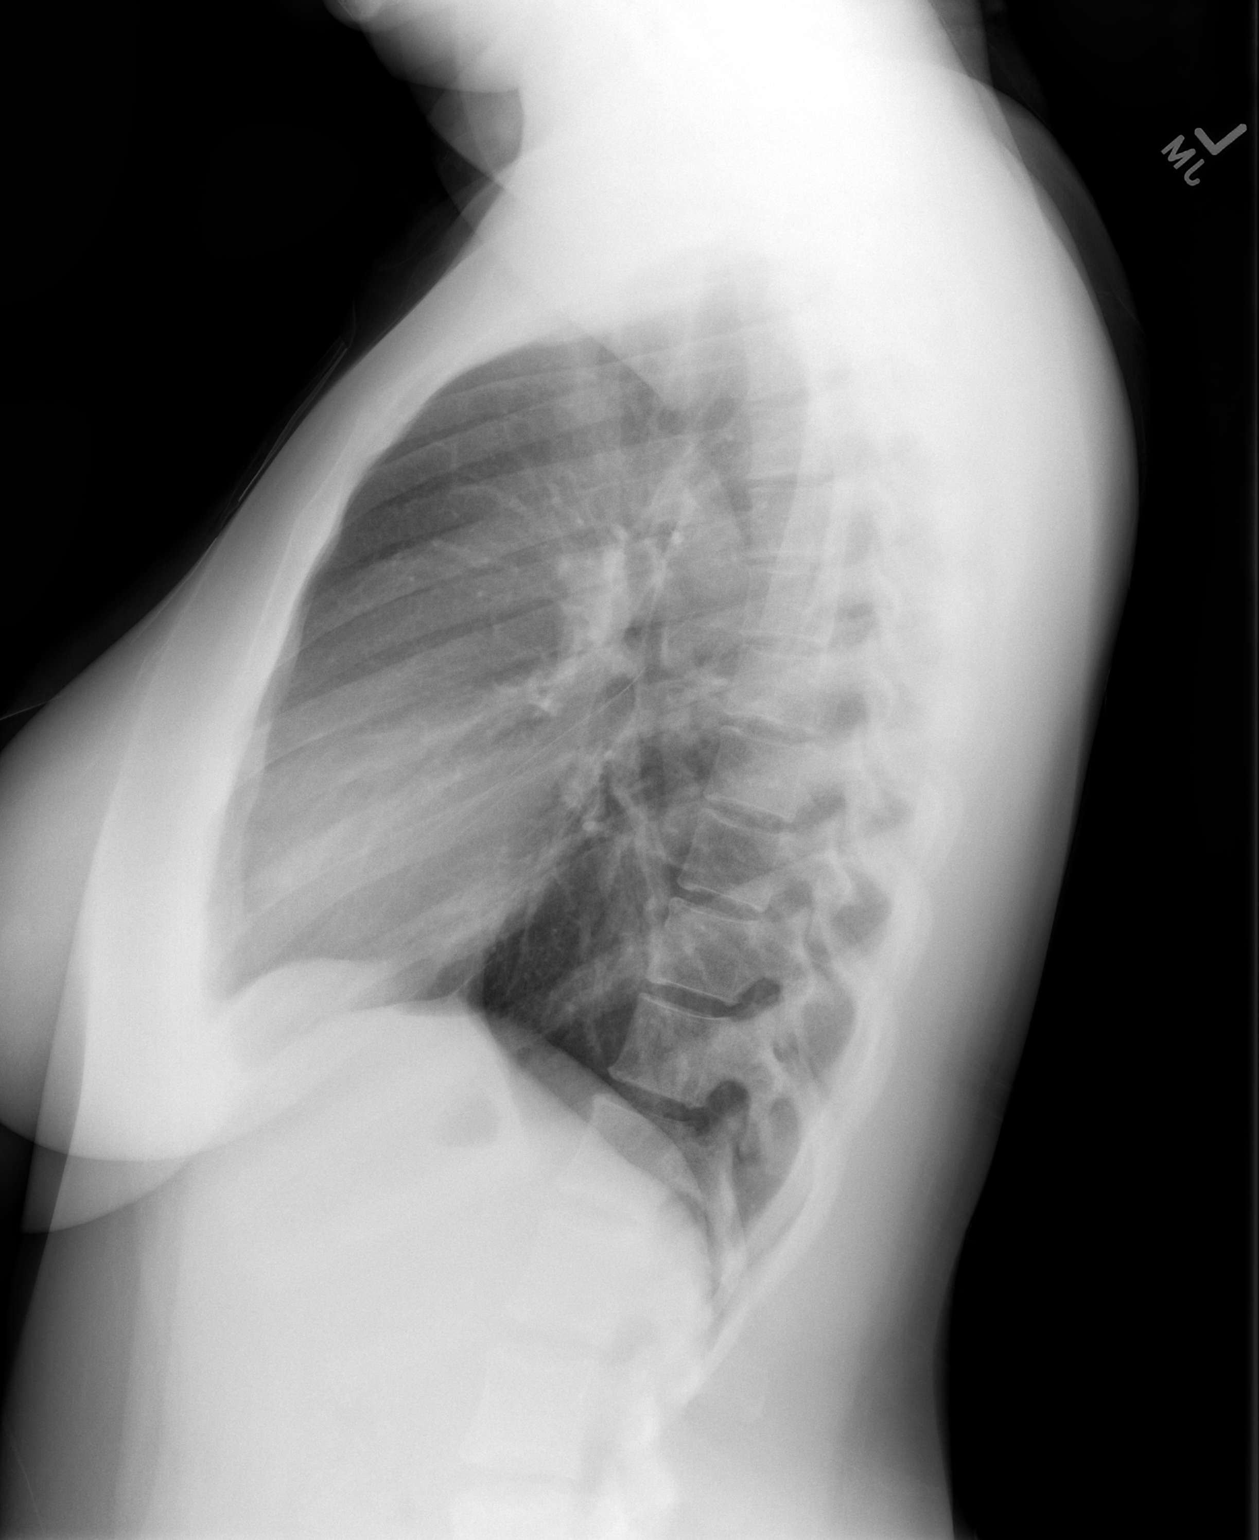

[2 of 2 positions shown; findings below may reference images not displayed]

FINDINGS: Cardiac silhouette and mediastinal contours are within normal
limits. The lungs are clear. No pleural effusion or pneumothorax. No
acute skeletal abnormality.
IMPRESSION: No active cardiopulmonary disease.

## 2023-05-21 ENCOUNTER — Ambulatory Visit: Payer: Self-pay | Admitting: Family

## 2023-05-21 ENCOUNTER — Ambulatory Visit (INDEPENDENT_AMBULATORY_CARE_PROVIDER_SITE_OTHER): Payer: Medicaid Other | Admitting: Internal Medicine

## 2023-05-21 ENCOUNTER — Encounter: Payer: Self-pay | Admitting: Family

## 2023-05-21 ENCOUNTER — Encounter: Payer: Self-pay | Admitting: Internal Medicine

## 2023-05-21 ENCOUNTER — Other Ambulatory Visit (HOSPITAL_BASED_OUTPATIENT_CLINIC_OR_DEPARTMENT_OTHER): Payer: Self-pay

## 2023-05-21 VITALS — BP 122/80 | HR 89 | Temp 99.3°F | Resp 16 | Ht 62.3 in | Wt 186.1 lb

## 2023-05-21 DIAGNOSIS — J101 Influenza due to other identified influenza virus with other respiratory manifestations: Secondary | ICD-10-CM | POA: Diagnosis not present

## 2023-05-21 LAB — POCT INFLUENZA A/B
Influenza A, POC: POSITIVE — AB
Influenza B, POC: NEGATIVE

## 2023-05-21 LAB — POC COVID19 BINAXNOW: SARS Coronavirus 2 Ag: NEGATIVE

## 2023-05-21 MED ORDER — OSELTAMIVIR PHOSPHATE 75 MG PO CAPS
75.0000 mg | ORAL_CAPSULE | Freq: Two times a day (BID) | ORAL | 0 refills | Status: DC
  Filled 2023-05-21: qty 10, 5d supply, fill #0

## 2023-05-21 MED ORDER — ONDANSETRON HCL 4 MG PO TABS
4.0000 mg | ORAL_TABLET | Freq: Three times a day (TID) | ORAL | 0 refills | Status: DC | PRN
  Filled 2023-05-21: qty 20, 7d supply, fill #0

## 2023-05-21 NOTE — Telephone Encounter (Signed)
Copied from CRM [id number]. Topic: Clinical - Red Word Triage >> May 21, 2023 11:15 AM Irine Seal wrote: [facility] that prompted transfer to Nurse Triage: muscle pain, body aches, vomiting, dehydrated, hot and cold chills with sweating. Fever. Onset Friday night. Sunday is when symptoms worsened. Flu like symptoms   Chief Complaint: Muscle aches Symptoms: Muscle aches, chills, mild SOB, cough Pertinent Negatives: Patient denies vomiting today Disposition: []ED /[]Urgent Care (no appt availability in office) / [x]Appointment(In office/virtual)/ [] [facility]/ []Home Care/ []Refused Recommended Disposition /[][facility]/ [] Follow-up with PCP Additional Notes:  Patient has been feeling sick since Friday. She has been having muscle aches, chills, mild SOB, cough. She was vomiting yesterday but it resolved today. Her temp is currently 100.5. Appointment scheduled for this afternoon     Reason for Disposition  [1] MILD difficulty breathing (e.g., minimal/no SOB at rest, SOB with walking, pulse <100) AND [2] still present when not coughing  Answer Assessment - Initial Assessment Questions 1. ONSET: "When did the cough begin?"      Saturday   2. SPUTUM: "Describe the color of your sputum" (none, dry cough; clear, white, yellow, green)     Tan color   3. DIFFICULTY BREATHING: "Are you having difficulty breathing?" If Yes, ask: "How bad is it?" (e.g., mild, moderate, severe)    - MILD: No SOB at rest, mild SOB with walking, speaks normally in sentences, can lie down, no retractions, pulse < 100.    - MODERATE: SOB at rest, SOB with minimal exertion and prefers to sit, cannot lie down flat, speaks in phrases, mild retractions, audible wheezing, pulse 100-120.    - SEVERE: Very SOB at rest, speaks in single words, struggling to breathe, sitting hunched forward, retractions, pulse > 120      SOB today and yesterday. Usually occurs with activity   4. FEVER: "Do you have a fever?"  If Yes, ask: "What is your temperature, how was it measured, and when did it start?"     10 0.5 currently   5. OTHER SYMPTOMS: "Do you have any other symptoms?" (e.g., runny nose, wheezing, chest pain) Runny nose, muscle pain, chills,  Protocols used: Cough - Acute Productive-A-AH

## 2023-05-21 NOTE — Progress Notes (Unsigned)
   Subjective:    Patient ID: Grace Marshall, female    DOB: [birth year], 23 y.o.   MRN: [id number]  DOS:  05/21/2023 Type of visit - description: Acute  Symptoms started late on 05/18/2023: Started with chills.  That night she had night sweats. The chills persisted throughout the next day. Yesterday developed multiple symptoms including cough, sinus and chest congestion, sneezing, runny nose. She is also running a temperature, this morning was 100.5. Has not taken Tylenol or ibuprofen. Yesterday developed several episodes of nausea vomiting and had diarrhea without blood in the stools.  No abdominal pain.   Review of Systems See above   Past Medical History:  Diagnosis Date   Helicobacter pylori gastritis    per EGD 10/05/20   Hyperlipidemia    Iron deficiency    Migraine with aura    Trichomoniasis 2022   Vitamin D deficiency     Past Surgical History:  Procedure Laterality Date   WISDOM TOOTH EXTRACTION  2022    Current Outpatient Medications  Medication Instructions   Nexplanon 68 mg, Subdermal,  Once   predniSONE (DELTASONE) 20 mg, Oral, Daily with breakfast   rizatriptan (MAXALT-MLT) 10 mg, Oral, As needed, May repeat in 2 hours if needed   terbinafine (LAMISIL) 250 mg, Oral, Daily       Objective:   Physical Exam BP 122/80   Pulse 89   Temp 99.3 F (37.4 C) (Oral)   Resp 16   Ht 5' 2.3" (1.582 m)   Wt 186 lb 2 oz (84.4 kg)   SpO2 98%   BMI 33.72 kg/m  General:   Well developed, nontoxic appearing. HEENT:  Normocephalic . Face symmetric, atraumatic.  Throat symmetric, not red, no white patches. Nose slightly congested  Lungwver rhonchi with forced cough. Normal respiratory effort, no intercostal retractions, no accessory muscle use. Heart: RRR,  no murmur.  Lower extremities: no pretibial edema bilaterally  Skin: Not pale. Not jaundice Neurologic:  alert & oriented X3.  Speech normal, gait appropriate for age and unassisted Psych--  Cognition  and judgment appear intact.  Cooperative with normal attention span and concentration.  Behavior appropriate. No anxious or depressed appearing.      Assessment     23 year old female, history of migraines, birth control with Nexplanon, presents with:  Influenza: Symptoms started slightly above 48 hours, has both respiratory and GI symptoms, nontoxic appearing. 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 COVID test today is negative, she tested positive for influenza A. Plan: Start Tamiflu ASAP, treat symptomatically with fluids, Tylenol, rest, Zofran for nausea, Pepto-Bismol for diarrhea. Call if not better, see AVS.  Work note provided.

## 2023-05-21 NOTE — Patient Instructions (Signed)
You have influenza  Start the antiviral medication Tamiflu ASAP.  For the nausea, I sent medication called Zofran  You can take some Pepto-Bismol as needed for diarrhea   Rest  Drink plenty of fluids  Tylenol  500 mg OTC 2 tabs a day every 8 hours as needed for fever, aches and pains  For cough:  Take Mucinex DM or Robitussin-DM OTC.  Follow the instructions in the box.       Call if not gradually better over the next 5 to 6 days   Call anytime if the symptoms are severe, you have high fever, short of breath, chest pain

## 2023-08-01 ENCOUNTER — Encounter: Payer: Self-pay | Admitting: Family

## 2023-08-07 ENCOUNTER — Other Ambulatory Visit (HOSPITAL_BASED_OUTPATIENT_CLINIC_OR_DEPARTMENT_OTHER): Payer: Self-pay

## 2023-08-07 ENCOUNTER — Ambulatory Visit (INDEPENDENT_AMBULATORY_CARE_PROVIDER_SITE_OTHER): Admitting: Family

## 2023-08-07 ENCOUNTER — Other Ambulatory Visit (HOSPITAL_COMMUNITY)
Admission: RE | Admit: 2023-08-07 | Discharge: 2023-08-07 | Disposition: A | Discharge status: Home / Self Care | Source: Ambulatory Visit | Attending: Family | Admitting: Family

## 2023-08-07 VITALS — BP 110/52 | HR 65 | Temp 98.7°F | Resp 16 | Ht 62.3 in | Wt 179.0 lb

## 2023-08-07 DIAGNOSIS — Z124 Encounter for screening for malignant neoplasm of cervix: Secondary | ICD-10-CM | POA: Insufficient documentation

## 2023-08-07 DIAGNOSIS — R102 Pelvic and perineal pain: Secondary | ICD-10-CM | POA: Insufficient documentation

## 2023-08-07 MED ORDER — FLUCONAZOLE 150 MG PO TABS
150.0000 mg | ORAL_TABLET | Freq: Once | ORAL | 0 refills | Status: AC
  Filled 2023-08-07 (×2): qty 1, 1d supply, fill #0

## 2023-08-07 MED ORDER — FLUCONAZOLE 150 MG PO TABS
150.0000 mg | ORAL_TABLET | Freq: Once | ORAL | 0 refills | Status: DC
Start: 2023-08-07 — End: 2023-08-07

## 2023-08-07 NOTE — Progress Notes (Unsigned)
 Subjective:     Patient ID: Grace Marshall, female    DOB: 12/23/2000, 23 y.o.   MRN: [id number]  Chief Complaint  Patient presents with   Vaginal Pain    Complains of vaginal pain and itching    HPI  Discussed the use of AI scribe software for clinical note transcription with the patient, who gave verbal consent to proceed.  History of Present Illness  She is a 23 year old female who presents with vaginal pain. She has been experiencing vaginal pain since Sunday night, describing it as both external and internal, particularly at the entrance. The pain was severe last night. She attempted to use an over-the-counter treatment, which resulted in a burning sensation. She has experienced similar pain in the past after using a similar treatment. She had sexual intercourse with a new partner on the Friday before the onset of symptoms. No unusual discharge is noted.      Health Maintenance Due  Topic Date Due   HPV VACCINES (1 - 3-dose series) Never done   HIV Screening  Never done   Meningococcal B Vaccine (1 of 2 - Standard) Never done   Hepatitis C Screening  Never done   Cervical Cancer Screening (Pap smear)  Never done   DTaP/Tdap/Td (8 - Td or Tdap) 01/15/2022   COVID-19 Vaccine (3 - 2024-25 season) 12/24/2022    Past Medical History:  Diagnosis Date   Helicobacter pylori gastritis    per EGD 10/05/20   Hyperlipidemia    Iron deficiency    Migraine with aura    Trichomoniasis 2022   Vitamin D deficiency     Past Surgical History:  Procedure Laterality Date   WISDOM TOOTH EXTRACTION  2022    Family History  Adopted: Yes  Problem Relation Age of Onset   Hypertension Father    Diabetes Father    Hypertension Paternal Grandmother    Diabetes Paternal Grandmother    Hypertension Paternal Grandfather    Diabetes Paternal Grandfather     Social History   Socioeconomic History   Marital status: Single    Spouse name: Not on file   Number of children: 1    Years of education: Not on file   Highest education level: Not on file  Occupational History   Occupation: Pharmacologist   Occupation: Runner, broadcasting/film/video  Tobacco Use   Smoking status: Never   Smokeless tobacco: Never  Vaping Use   Vaping status: Every Day   Start date: 07/23/2020  Substance and Sexual Activity   Alcohol use: Yes    Comment: occasionaly   Drug use: Not Currently   Sexual activity: Not Currently    Birth control/protection: Inserts    Comment: nexplanon  Other Topics Concern   Not on file  Social History Narrative   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  rizatriptan (MAXALT-MLT) 10 MG disintegrating tablet Take 1 tablet (10 mg total) by mouth as needed for migraine. May repeat in 2 hours if needed 10 tablet 0   ondansetron (ZOFRAN) 4 MG tablet Take 1 tablet (4 mg total) by mouth every 8 (eight) hours as needed for nausea or vomiting. 20 tablet 0   oseltamivir (TAMIFLU) 75 MG capsule Take 1 capsule (75 mg total) by mouth 2 (two) times daily. 10 capsule 0   Facility-Administered Medications Prior to Visit  Medication Dose Route Frequency Provider Last Rate Last Admin   etonogestrel (NEXPLANON) implant 68 mg  68 mg Subdermal Once         No Known Allergies  ROS See HPI    Objective:    Physical Exam Exam conducted with a chaperone present.  Constitutional:      Appearance: Normal appearance.  Cardiovascular:     Rate and Rhythm: Normal  rate.  Pulmonary:     Effort: Pulmonary effort is normal.  Genitourinary:    General: Normal vulva.     Exam position: Lithotomy position.     Labia:        Right: No rash.      Vagina: Normal.     Cervix: Normal.     Uterus: Normal.      Adnexa: Right adnexa normal and left adnexa normal.       Right: No mass or tenderness.         Left: No mass or tenderness.       Comments: Introitus appears very red but no ulcers or discharge noted. Neurological:     Mental Status: She is alert.      BP (!) 110/52 (BP Location: Right Arm, Patient Position: Sitting, Cuff Size: Normal)   Pulse 65   Temp 98.7 F (37.1 C) (Oral)   Resp 16   Ht 5' 2.3" (1.582 m)   Wt 179 lb (81.2 kg)   SpO2 100%   BMI 32.42 kg/m  Wt Readings from Last 3 Encounters:  08/07/23 179 lb (81.2 kg)  05/21/23 186 lb 2 oz (84.4 kg)  02/14/23 202 lb (91.6 kg)       Assessment & Plan:   Problem List Items Addressed This Visit       Unprioritized   Vaginal pain - Primary   Also having itching. Swab to be sent for GC/Chlamydia/trich/yeast/bv.  Rx sent for empiric diflucan in the meantime.       Relevant Orders   Cervicovaginal ancillary only( [place])   Cytology - PAP( [place])   Pap smear for cervical cancer screening   Pap performed today.       Relevant Orders   Cytology - PAP( [place])    I have discontinued Julaine Fusi. Doyon's oseltamivir, ondansetron, and fluconazole. I am also having her start on fluconazole. Additionally, I am having her maintain her rizatriptan and Nexplanon. We will continue to administer etonogestrel.  Meds ordered this encounter  Medications   DISCONTD: fluconazole (DIFLUCAN) 150 MG tablet    Sig: Take 1 tablet (150 mg total) by mouth once for 1 dose.    Dispense:  1 tablet    Refill:  0    Supervising Provider:   Danise Edge A [[id number]]   fluconazole (DIFLUCAN) 150 MG tablet    Sig: Take 1 tablet (150 mg total) by mouth once for 1 dose.    Dispense:  1  tablet    Refill:  0  Supervising Provider:   Randie Bustle A [phone]

## 2023-08-07 NOTE — Patient Instructions (Signed)
 VISIT SUMMARY:  You came in today because you have been experiencing vaginal pain since Sunday night. The pain is both external and internal, particularly at the entrance, and was severe last night. You tried an over-the-counter treatment, which caused a burning sensation. You mentioned having sexual intercourse with a new partner before the symptoms started.  YOUR PLAN:  -VAGINAL PAIN: Vaginal pain can be caused by various infections or conditions, including yeast infection, bacterial vaginosis, gonorrhea, chlamydia, and herpes. We will perform a speculum exam to assess the vaginal area and collect specimens. We will also order a Pap smear and send swabs for testing for gonorrhea, chlamydia, yeast, and bacterial vaginosis. Additionally, we will examine for any signs of ulcers or herpes.  INSTRUCTIONS:  Please follow up with us  once the test results are available so we can discuss the next steps in your treatment.

## 2023-08-07 NOTE — Assessment & Plan Note (Signed)
 Also having itching. Swab to be sent for GC/Chlamydia/trich/yeast/bv.  Rx sent for empiric diflucan in the meantime.

## 2023-08-07 NOTE — Assessment & Plan Note (Signed)
 Pap performed today.

## 2023-08-09 ENCOUNTER — Telehealth: Payer: Self-pay | Admitting: Family

## 2023-08-09 LAB — CERVICOVAGINAL ANCILLARY ONLY
Bacterial Vaginitis (gardnerella): NEGATIVE
Candida Glabrata: NEGATIVE
Candida Vaginitis: POSITIVE — AB
Chlamydia: NEGATIVE
Comment: NEGATIVE
Comment: NEGATIVE
Comment: NEGATIVE
Comment: NEGATIVE
Comment: NEGATIVE
Comment: NORMAL
Neisseria Gonorrhea: NEGATIVE
Trichomonas: NEGATIVE

## 2023-08-09 MED ORDER — FLUCONAZOLE 150 MG PO TABS: ORAL_TABLET | ORAL | 0 refills | Status: DC

## 2023-08-09 NOTE — Telephone Encounter (Signed)
 See mychart.

## 2023-08-14 ENCOUNTER — Encounter: Payer: Self-pay | Admitting: Family

## 2023-08-14 LAB — CYTOLOGY - PAP
Comment: NEGATIVE
Diagnosis: NEGATIVE
Diagnosis: REACTIVE
High risk HPV: NEGATIVE

## 2023-09-20 ENCOUNTER — Ambulatory Visit
Admission: EM | Admit: 2023-09-20 | Discharge: 2023-09-20 | Disposition: A | Discharge status: Home / Self Care | Attending: Nurse Practitioner | Admitting: Nurse Practitioner

## 2023-09-20 DIAGNOSIS — J019 Acute sinusitis, unspecified: Secondary | ICD-10-CM

## 2023-09-20 MED ORDER — PREDNISONE 20 MG PO TABS: 40.0000 mg | ORAL_TABLET | Freq: Every day | ORAL | 0 refills | Status: AC

## 2023-09-20 MED ORDER — PSEUDOEPH-BROMPHEN-DM 30-2-10 MG/5ML PO SYRP: 10.0000 mL | ORAL_SOLUTION | Freq: Four times a day (QID) | ORAL | 0 refills | Status: DC | PRN

## 2023-09-20 MED ORDER — FLUTICASONE PROPIONATE 50 MCG/ACT NA SUSP: 2.0000 | Freq: Every day | NASAL | 0 refills | Status: AC

## 2023-09-20 MED ORDER — AZITHROMYCIN 250 MG PO TABS: 250.0000 mg | ORAL_TABLET | Freq: Every day | ORAL | 0 refills | Status: DC

## 2023-09-20 NOTE — ED Provider Notes (Signed)
 [facility]    CSN: [id number] Arrival date & time: 09/20/23  1741      History   Chief Complaint Chief Complaint  Patient presents with   Cough   Fatigue   Facial Pain    HPI Grace Marshall is a 23 y.o. female.   Grace Marshall is a 23 y.o. female that presents with a 10-day history of multiple symptoms including facial pressure, chills, body aches, chest tightness, neck tension, runny nose, congestion, sore throat, and a severe headache. The patient reports vomiting once this morning after eating lunch.The facial pressure has been persistent for the past 10 days. The patient experiences chills and body aches, which contribute to overall discomfort. Chest tightness is noted, along with tension when turning the neck. Upper respiratory symptoms include a runny nose and congestion. The patient also complains of a sore throat and a bad headache. This morning, the patient experienced a single episode of vomiting after consuming lunch. The patient denies fever, shortness of breath, wheezing, or diarrhea.  The following portions of the patient's history were reviewed and updated as appropriate: allergies, current medications, past family history, past medical history, past social history, past surgical history, and problem list.    Past Medical History:  Diagnosis Date   Helicobacter pylori gastritis    per EGD 10/05/20   Hyperlipidemia    Iron deficiency    Migraine with aura    Trichomoniasis 2022   Vitamin D deficiency     Patient Active Problem List   Diagnosis Date Noted   Vaginal pain 08/07/2023   Pap smear for cervical cancer screening 08/07/2023   Encounter for removal and reinsertion of Nexplanon  01/17/2023   Vitamin D deficiency 09/11/2022   Migraine with aura 09/11/2022   Hyperlipidemia 09/11/2022   Helicobacter pylori gastritis 09/11/2022   Iron deficiency 09/11/2022   Nicotine abuse 08/22/2022   Tinea pedis of both feet 08/22/2022   Onychomycosis  08/22/2022   Labral tear of shoulder, right, subsequent encounter 06/23/2022    Past Surgical History:  Procedure Laterality Date   WISDOM TOOTH EXTRACTION  2022    OB History   No obstetric history on file.      Home Medications    Prior to Admission medications   Medication Sig Start Date End Date Taking? Authorizing Provider  azithromycin (ZITHROMAX) 250 MG tablet Take 1 tablet (250 mg total) by mouth daily. Take first 2 tablets together, then 1 every day until finished. 09/20/23  Yes Maryruth Sol, FNP  brompheniramine-pseudoephedrine-DM 30-2-10 MG/5ML syrup Take 10 mLs by mouth every 6 (six) hours as needed (cough and congestion). 09/20/23  Yes Maryruth Sol, FNP  etonogestrel  (NEXPLANON ) 68 MG IMPL implant 1 each (68 mg total) by Subdermal route once for 1 dose. 02/14/23  Yes Dorrene Gaucher, NP  fluticasone  (FLONASE ) 50 MCG/ACT nasal spray Place 2 sprays into both nostrils daily. Shake well before use. Gently blow nose before spraying. Do not blow nose immediately after use. You should not taste the medication or feel it going down your throat; if you do, adjust your technique. 09/20/23  Yes Keisean Skowron, FNP  predniSONE  (DELTASONE ) 20 MG tablet Take 2 tablets (40 mg total) by mouth daily with breakfast for 5 days. 09/20/23 09/25/23 Yes Maryruth Sol, FNP  rizatriptan  (MAXALT -MLT) 10 MG disintegrating tablet Take 1 tablet (10 mg total) by mouth as needed for migraine. May repeat in 2 hours if needed 11/07/22   Crecencio Dodge, Yvonne R, DO  Family History Family History  Adopted: Yes  Problem Relation Age of Onset   Hypertension Father    Diabetes Father    Hypertension Paternal Grandmother    Diabetes Paternal Grandmother    Hypertension Paternal Grandfather    Diabetes Paternal Grandfather     Social History Social History   Tobacco Use   Smoking status: Never   Smokeless tobacco: Never  Vaping Use   Vaping status: Every Day   Start date: 07/23/2020   Substance Use Topics   Alcohol use: Yes    Comment: occasionaly   Drug use: Not Currently     Allergies   Patient has no known allergies.   Review of Systems Review of Systems  Constitutional:  Positive for chills. Negative for fever.  HENT:  Positive for congestion, rhinorrhea, sinus pressure and sore throat.   Respiratory:  Positive for cough and chest tightness. Negative for shortness of breath and wheezing.   Gastrointestinal:  Positive for vomiting (x1 this AM). Negative for diarrhea and nausea.  Musculoskeletal:  Positive for myalgias.  Neurological:  Positive for headaches.  All other systems reviewed and are negative.    Physical Exam Triage Vital Signs ED Triage Vitals  Encounter Vitals Group     BP 09/20/23 1819 109/70     Systolic BP Percentile --      Diastolic BP Percentile --      Pulse Rate 09/20/23 1819 95     Resp 09/20/23 1819 18     Temp 09/20/23 1819 97.9 F (36.6 C)     Temp Source 09/20/23 1819 Oral     SpO2 09/20/23 1819 98 %     Weight --      Height --      Head Circumference --      Peak Flow --      Pain Score 09/20/23 1820 6     Pain Loc --      Pain Education --      Exclude from Growth Chart --    No data found.  Updated Vital Signs BP 109/70 (BP Location: Left Arm)   Pulse 95   Temp 97.9 F (36.6 C) (Oral)   Resp 18   LMP 09/06/2023 (Approximate)   SpO2 98%   Visual Acuity Right Eye Distance:   Left Eye Distance:   Bilateral Distance:    Right Eye Near:   Left Eye Near:    Bilateral Near:     Physical Exam Vitals reviewed.  Constitutional:      General: She is awake. She is not in acute distress.    Appearance: Normal appearance. She is well-developed. She is not ill-appearing, toxic-appearing or diaphoretic.  HENT:     Head: Normocephalic.     Right Ear: Hearing, tympanic membrane, ear canal and external ear normal. No drainage, swelling or tenderness. No middle ear effusion. Tympanic membrane is not  erythematous.     Left Ear: Hearing, tympanic membrane, ear canal and external ear normal. No drainage, swelling or tenderness.  No middle ear effusion. Tympanic membrane is not erythematous.     Nose: Congestion present.     Right Sinus: No maxillary sinus tenderness or frontal sinus tenderness.     Left Sinus: No maxillary sinus tenderness or frontal sinus tenderness.     Mouth/Throat:     Lips: Pink.     Mouth: Mucous membranes are moist.     Pharynx: Oropharynx is clear. Uvula midline. No pharyngeal swelling, oropharyngeal exudate, posterior  oropharyngeal erythema or uvula swelling.     Tonsils: No tonsillar exudate or tonsillar abscesses.  Eyes:     General: Vision grossly intact.     Conjunctiva/sclera: Conjunctivae normal.  Cardiovascular:     Rate and Rhythm: Normal rate and regular rhythm.     Heart sounds: Normal heart sounds.  Pulmonary:     Effort: Pulmonary effort is normal.     Breath sounds: Normal breath sounds and air entry.  Musculoskeletal:        General: Normal range of motion.     Cervical back: Full passive range of motion without pain, normal range of motion and neck supple.  Lymphadenopathy:     Cervical: No cervical adenopathy.  Skin:    General: Skin is warm and dry.  Neurological:     General: No focal deficit present.     Mental Status: She is alert and oriented to person, place, and time.  Psychiatric:        Mood and Affect: Mood normal.        Behavior: Behavior normal. Behavior is cooperative.      UC Treatments / Results  Labs (all labs ordered are listed, but only abnormal results are displayed) Labs Reviewed - No data to display  EKG   Radiology No results found.  Procedures Procedures (including critical care time)  Medications Ordered in UC Medications - No data to display  Initial Impression / Assessment and Plan / UC Course  I have reviewed the triage vital signs and the nursing notes.  Pertinent labs & imaging results  that were available during my care of the patient were reviewed by me and considered in my medical decision making (see chart for details).     Patient presents with a 10-day history of symptoms including facial pressure, chills, body aches, chest tightness, neck tension, runny nose, congestion, sore throat, and headache. One episode of post-prandial emesis occurred this morning. There is no reported fever, shortness of breath, wheezing, or diarrhea. Based on the duration and symptom pattern, the clinical picture is most consistent with acute sinusitis. Z-pack, prednisone , bromfed DM, and Flonase  were prescribed for symptom management.  Today's evaluation has revealed no signs of a dangerous process. Discussed diagnosis with patient and/or guardian. Patient and/or guardian aware of their diagnosis, possible red flag symptoms to watch out for and need for close follow up. Patient and/or guardian understands verbal and written discharge instructions. Patient and/or guardian comfortable with plan and disposition.  Patient and/or guardian has a clear mental status at this time, good insight into illness (after discussion and teaching) and has clear judgment to make decisions regarding their care  Documentation was completed with the aid of voice recognition software. Transcription may contain typographical errors.  Final Clinical Impressions(s) / UC Diagnoses   Final diagnoses:  Acute sinusitis, recurrence not specified, unspecified location     Discharge Instructions      You have sinus infection. A sinus infection, also called sinusitis, is inflammation of your sinuses. Take medications as prescribed. Make sure you finish all the prescribed medications even if you start to feel better. You may also take tylenol  and/or ibuprofen  as needed for pain and/or fever. Drink enough fluid to keep your urine pale yellow. Staying hydrated will also help to thin your mucus. Use a cool mist humidifier to keep  the humidity level in your home above 50%. Inhale steam for 10-15 minutes, 3-4 times a day. You can do this in the bathroom while  a hot shower is running and/or purchase over-the-counter vapor shower tablets which is great to help with nasal congestion.Try to limit your exposure to cool or dry air. Sleep with your head raised to decrease post-nasal drainage. Make sure you get enough sleep each night .Once you have finished your medication and your symptoms have improved, remember to replace your toothbrush to help prevent re-infection.   It's normal for a cough to linger for several weeks after a respiratory illness, even after other symptoms have resolved. This happens because the airways remain irritated and take time to fully heal. As long as the cough gradually improves and there are no new concerning symptoms, this is part of the normal recovery process.  If your symptoms do not improved after completing medications, please follow-up with your healthcare provider.    ED Prescriptions     Medication Sig Dispense Auth. Provider   azithromycin (ZITHROMAX) 250 MG tablet Take 1 tablet (250 mg total) by mouth daily. Take first 2 tablets together, then 1 every day until finished. 6 tablet Maryruth Sol, FNP   predniSONE  (DELTASONE ) 20 MG tablet Take 2 tablets (40 mg total) by mouth daily with breakfast for 5 days. 10 tablet Maryruth Sol, FNP   brompheniramine-pseudoephedrine-DM 30-2-10 MG/5ML syrup Take 10 mLs by mouth every 6 (six) hours as needed (cough and congestion). 120 mL Maryruth Sol, FNP   fluticasone  (FLONASE ) 50 MCG/ACT nasal spray Place 2 sprays into both nostrils daily. Shake well before use. Gently blow nose before spraying. Do not blow nose immediately after use. You should not taste the medication or feel it going down your throat; if you do, adjust your technique. 16 g Maryruth Sol, FNP      PDMP not reviewed this encounter.   Maryruth Sol, [place] 09/20/23  [phone]

## 2023-09-20 NOTE — ED Triage Notes (Signed)
 Patient reports a 10-day history of cough with right side facial pressure. Denies any vomiting or diarrhea.

## 2023-09-20 NOTE — Discharge Instructions (Addendum)
 You have sinus infection. A sinus infection, also called sinusitis, is inflammation of your sinuses. Take medications as prescribed. Make sure you finish all the prescribed medications even if you start to feel better. You may also take tylenol  and/or ibuprofen  as needed for pain and/or fever. Drink enough fluid to keep your urine pale yellow. Staying hydrated will also help to thin your mucus. Use a cool mist humidifier to keep the humidity level in your home above 50%. Inhale steam for 10-15 minutes, 3-4 times a day. You can do this in the bathroom while a hot shower is running and/or purchase over-the-counter vapor shower tablets which is great to help with nasal congestion.Try to limit your exposure to cool or dry air. Sleep with your head raised to decrease post-nasal drainage. Make sure you get enough sleep each night .Once you have finished your medication and your symptoms have improved, remember to replace your toothbrush to help prevent re-infection.   It's normal for a cough to linger for several weeks after a respiratory illness, even after other symptoms have resolved. This happens because the airways remain irritated and take time to fully heal. As long as the cough gradually improves and there are no new concerning symptoms, this is part of the normal recovery process.  If your symptoms do not improved after completing medications, please follow-up with your healthcare provider.

## 2024-01-02 ENCOUNTER — Ambulatory Visit
Admission: EM | Admit: 2024-01-02 | Discharge: 2024-01-02 | Disposition: A | Discharge status: Home / Self Care | Attending: Family Medicine | Admitting: Family Medicine

## 2024-01-02 DIAGNOSIS — R197 Diarrhea, unspecified: Secondary | ICD-10-CM

## 2024-01-02 DIAGNOSIS — J029 Acute pharyngitis, unspecified: Secondary | ICD-10-CM

## 2024-01-02 DIAGNOSIS — B349 Viral infection, unspecified: Secondary | ICD-10-CM | POA: Diagnosis not present

## 2024-01-02 DIAGNOSIS — R112 Nausea with vomiting, unspecified: Secondary | ICD-10-CM | POA: Diagnosis not present

## 2024-01-02 LAB — POCT URINE PREGNANCY: Preg Test, Ur: NEGATIVE

## 2024-01-02 LAB — POCT URINE DIPSTICK
Bilirubin, UA: NEGATIVE
Glucose, UA: NEGATIVE mg/dL
Ketones, POC UA: NEGATIVE mg/dL
Leukocytes, UA: NEGATIVE
Nitrite, UA: NEGATIVE
POC PROTEIN,UA: NEGATIVE
Spec Grav, UA: 1.025 (ref 1.010–1.025)
Urobilinogen, UA: 0.2 U/dL
pH, UA: 6.5 (ref 5.0–8.0)

## 2024-01-02 LAB — POCT RAPID STREP A (OFFICE): Rapid Strep A Screen: NEGATIVE

## 2024-01-02 LAB — POC SOFIA SARS ANTIGEN FIA: SARS Coronavirus 2 Ag: NEGATIVE

## 2024-01-02 MED ORDER — ONDANSETRON 4 MG PO TBDP
4.0000 mg | ORAL_TABLET | Freq: Three times a day (TID) | ORAL | 0 refills | Status: DC | PRN
Start: 2024-01-02 — End: 2024-02-27

## 2024-01-02 MED ORDER — LOPERAMIDE HCL 2 MG PO CAPS
2.0000 mg | ORAL_CAPSULE | Freq: Four times a day (QID) | ORAL | 0 refills | Status: DC | PRN
Start: 2024-01-02 — End: 2024-02-27

## 2024-01-02 NOTE — ED Triage Notes (Signed)
 Pt present with a sore throat, diarrhea and stomach pain x four days. Denies fever. Reports vomiting and nausea, no headaches. 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  Home interventions: tylenol , ibuprofen 

## 2024-01-02 NOTE — ED Provider Notes (Signed)
 [facility]    CSN: [id number] Arrival date & time: 01/02/24  0802      History   Chief Complaint Chief Complaint  Patient presents with   Abdominal Pain   Diarrhea    HPI Grace [place] is a 23 y.o. female  presents for evaluation of URI symptoms for 4 days. Patient reports associated symptoms of nonbilious nonbloody vomiting and diarrhea.  Also reports a sore throat.  States she has had low-grade fevers of 99 degrees.  Denies cough, congestion, ear pain, body aches, shortness of breath. Patient does not have a hx of asthma.  Denies history of GI diagnoses such as Crohn's, IBS, colitis, diverticulitis.  No abdominal surgeries.  No dysuria.  States she can eat and drink but she immediately has to go to the bathroom afterwards.   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  Pt has taken Tylenol  and ibuprofen  OTC for symptoms. Pt has no other concerns at this time.    Abdominal Pain Associated symptoms: diarrhea, fever, nausea and sore throat   Diarrhea Associated symptoms: abdominal pain and fever     Past Medical History:  Diagnosis Date   Helicobacter pylori gastritis    per EGD 10/05/20   Hyperlipidemia    Iron deficiency    Migraine with aura    Trichomoniasis 2022   Vitamin D deficiency     Patient Active Problem List   Diagnosis Date Noted   Vaginal pain 08/07/2023   Pap smear for cervical cancer screening 08/07/2023   Encounter for removal and reinsertion of Nexplanon  01/17/2023   Vitamin D deficiency 09/11/2022   Migraine with aura 09/11/2022   Hyperlipidemia 09/11/2022   Helicobacter pylori gastritis 09/11/2022   Iron deficiency 09/11/2022   Nicotine abuse 08/22/2022   Tinea pedis of both feet 08/22/2022   Onychomycosis 08/22/2022   Labral tear of shoulder, right, subsequent encounter 06/23/2022    Past Surgical History:  Procedure Laterality Date   WISDOM TOOTH EXTRACTION  2022    OB History   No  obstetric history on file.      Home Medications    Prior to Admission medications   Medication Sig Start Date End Date Taking? Authorizing Provider  loperamide  (IMODIUM ) 2 MG capsule Take 1 capsule (2 mg total) by mouth 4 (four) times daily as needed for diarrhea or loose stools. 01/02/24  Yes Ahnesti Townsend, Jodi R, NP  ondansetron  (ZOFRAN -ODT) 4 MG disintegrating tablet Take 1 tablet (4 mg total) by mouth every 8 (eight) hours as needed for nausea or vomiting. 01/02/24  Yes Lolly Glaus, Jodi R, NP  azithromycin  (ZITHROMAX ) 250 MG tablet Take 1 tablet (250 mg total) by mouth daily. Take first 2 tablets together, then 1 every day until finished. 09/20/23   Iola Lukes, FNP  brompheniramine-pseudoephedrine-DM 30-2-10 MG/5ML syrup Take 10 mLs by mouth every 6 (six) hours as needed (cough and congestion). 09/20/23   Iola Lukes, FNP  etonogestrel  (NEXPLANON ) 68 MG IMPL implant 1 each (68 mg total) by Subdermal route once for 1 dose. 02/14/23   O'Sullivan, Melissa, NP  fluticasone  (FLONASE ) 50 MCG/ACT nasal spray Place 2 sprays into both nostrils daily. Shake well before use. Gently blow nose before spraying. Do not blow nose immediately after use. You should not taste the medication or feel it going down your throat; if you do, adjust your technique. 09/20/23   Iola Lukes, FNP  rizatriptan  (MAXALT -MLT) 10 MG disintegrating tablet Take 1 tablet (10  mg total) by mouth as needed for migraine. May repeat in 2 hours if needed 11/07/22   Antonio Meth, Jamee SAUNDERS, DO    Family History Family History  Adopted: Yes  Problem Relation Age of Onset   Hypertension Father    Diabetes Father    Hypertension Paternal Grandmother    Diabetes Paternal Grandmother    Hypertension Paternal Grandfather    Diabetes Paternal Grandfather     Social History Social History   Tobacco Use   Smoking status: Never   Smokeless tobacco: Never  Vaping Use   Vaping status: Every Day   Start date: 07/23/2020  Substance  Use Topics   Alcohol use: Yes    Comment: occasionaly   Drug use: Not Currently     Allergies   Patient has no known allergies.   Review of Systems Review of Systems  Constitutional:  Positive for fever.  HENT:  Positive for sore throat.   Gastrointestinal:  Positive for abdominal pain, diarrhea and nausea.     Physical Exam Triage Vital Signs ED Triage Vitals  Encounter Vitals Group     BP 01/02/24 0822 110/72     Girls Systolic BP Percentile --      Girls Diastolic BP Percentile --      Boys Systolic BP Percentile --      Boys Diastolic BP Percentile --      Pulse Rate 01/02/24 0822 80     Resp 01/02/24 0822 16     Temp 01/02/24 0822 99 F (37.2 C)     Temp Source 01/02/24 0822 Oral     SpO2 01/02/24 0822 97 %     Weight --      Height --      Head Circumference --      Peak Flow --      Pain Score 01/02/24 0820 8     Pain Loc --      Pain Education --      Exclude from Growth Chart --    No data found.  Updated Vital Signs BP 110/72 (BP Location: Left Arm)   Pulse 80   Temp 99 F (37.2 C) (Oral)   Resp 16   LMP 12/29/2023 (Exact Date)   SpO2 97%   Visual Acuity Right Eye Distance:   Left Eye Distance:   Bilateral Distance:    Right Eye Near:   Left Eye Near:    Bilateral Near:     Physical Exam Vitals and nursing note reviewed.  Constitutional:      General: She is not in acute distress.    Appearance: She is well-developed. She is not ill-appearing.  HENT:     Head: Normocephalic and atraumatic.     Right Ear: Tympanic membrane and ear canal normal.     Left Ear: Tympanic membrane and ear canal normal.     Nose: No congestion or rhinorrhea.     Mouth/Throat:     Mouth: Mucous membranes are moist.     Pharynx: Oropharynx is clear. Uvula midline. Posterior oropharyngeal erythema present.     Tonsils: No tonsillar exudate or tonsillar abscesses.  Eyes:     Conjunctiva/sclera: Conjunctivae normal.     Pupils: Pupils are equal, round, and  reactive to light.  Cardiovascular:     Rate and Rhythm: Normal rate and regular rhythm.     Heart sounds: Normal heart sounds.  Pulmonary:     Effort: Pulmonary effort is normal.  Breath sounds: Normal breath sounds.  Abdominal:     General: Abdomen is flat. Bowel sounds are increased.     Palpations: Abdomen is soft. There is no hepatomegaly or splenomegaly.     Tenderness: There is generalized abdominal tenderness. Negative signs include Rovsing's sign and McBurney's sign.  Musculoskeletal:     Cervical back: Normal range of motion and neck supple.  Lymphadenopathy:     Cervical: No cervical adenopathy.  Skin:    General: Skin is warm and dry.  Neurological:     General: No focal deficit present.     Mental Status: She is alert and oriented to person, place, and time.  Psychiatric:        Mood and Affect: Mood normal.        Behavior: Behavior normal.      UC Treatments / Results  Labs (all labs ordered are listed, but only abnormal results are displayed) Labs Reviewed  POCT URINE DIPSTICK - Abnormal; Notable for the following components:      Result Value   Color, UA other (*)    Clarity, UA cloudy (*)    Blood, UA large (*)    All other components within normal limits  POCT RAPID STREP A (OFFICE)  POC SOFIA SARS ANTIGEN FIA  POCT URINE PREGNANCY    EKG   Radiology No results found.  Procedures Procedures (including critical care time)  Medications Ordered in UC Medications - No data to display  Initial Impression / Assessment and Plan / UC Course  I have reviewed the triage vital signs and the nursing notes.  Pertinent labs & imaging results that were available during my care of the patient were reviewed by me and considered in my medical decision making (see chart for details).     Reviewed exam and symptoms with patient.  No red flags.  Negative rapid strep and COVID testing.  Urine negative for UTI.  Discussed viral illness and symptomatic  treatment.  Zofran  as needed for nausea vomiting.  Imodium  as needed for diarrhea.  Discussed electrolyte replacement/hydration and bland diet.  PCP follow-up as symptoms do not improve.  ER precautions reviewed. Final Clinical Impressions(s) / UC Diagnoses   Final diagnoses:  Sore throat  Nausea and vomiting, unspecified vomiting type  Diarrhea, unspecified type  Viral illness     Discharge Instructions      You tested negative for COVID and strep throat.  You may take Zofran  every 8 hours as needed for nausea or vomiting.  You may take Imodium  4 times a day as needed for diarrhea.  Focus on hydration/electrolyte replacement with Gatorade, Powerade, Pedialyte, water.  Bland diet and advance as you tolerate.  Lots of rest.  Please follow-up with your PCP if your symptoms do not improve.  Please go to the ER if you develop any worsening symptoms.  I hope you feel better soon!     ED Prescriptions     Medication Sig Dispense Auth. Provider   ondansetron  (ZOFRAN -ODT) 4 MG disintegrating tablet Take 1 tablet (4 mg total) by mouth every 8 (eight) hours as needed for nausea or vomiting. 8 tablet Jazyiah Yiu, Jodi R, NP   loperamide  (IMODIUM ) 2 MG capsule Take 1 capsule (2 mg total) by mouth 4 (four) times daily as needed for diarrhea or loose stools. 12 capsule Blannie Shedlock, Jodi R, NP      PDMP not reviewed this encounter.   Loreda Myla SAUNDERS, NP 01/02/24 0930

## 2024-01-02 NOTE — Discharge Instructions (Addendum)
 You tested negative for COVID and strep throat.  You may take Zofran  every 8 hours as needed for nausea or vomiting.  You may take Imodium  4 times a day as needed for diarrhea.  Focus on hydration/electrolyte replacement with Gatorade, Powerade, Pedialyte, water.  Bland diet and advance as you tolerate.  Lots of rest.  Please follow-up with your PCP if your symptoms do not improve.  Please go to the ER if you develop any worsening symptoms.  I hope you feel better soon!

## 2024-01-23 ENCOUNTER — Other Ambulatory Visit: Payer: Self-pay | Admitting: Family Medicine

## 2024-01-23 DIAGNOSIS — G43119 Migraine with aura, intractable, without status migrainosus: Secondary | ICD-10-CM

## 2024-02-20 ENCOUNTER — Ambulatory Visit

## 2024-02-20 ENCOUNTER — Telehealth: Payer: Self-pay

## 2024-02-20 ENCOUNTER — Telehealth: Payer: Self-pay | Admitting: Family

## 2024-02-20 DIAGNOSIS — Z111 Encounter for screening for respiratory tuberculosis: Secondary | ICD-10-CM | POA: Diagnosis not present

## 2024-02-20 NOTE — Telephone Encounter (Signed)
 Patient coming in for ppd placement today. She will be advised she will need ov to have forms completed due to not being seen in over 1 year. We can try to move appointment to a sooner slot if any openings.

## 2024-02-20 NOTE — Telephone Encounter (Signed)
 Patient coming in today for ppd skin test , need VO     Copied from CRM #[id number]. Topic: Clinical - Request for Lab/Test Order >> Feb 20, 2024  2:12 PM Grace Marshall wrote: Reason for CRM: Pt is requesting a TB test order and would like to have this completed as soon as possible. Pt stated that she needs this test completed for work and would like a callback to get an appt scheduled.

## 2024-02-20 NOTE — Telephone Encounter (Signed)
 Pt has a couple of things that she is needing.  - Pt dropped off a form to be completed by pcp that she needs for work. Pt is asking that we fax the form to her work and call her so she can pickup a copy. Placed in pcp's bin.  - Pt is trying to find a record of her last cpe, I was unable to find it in the system but was not sure if yall could see one. Pt is needing a cpe for work/school and scheduled one for December. Can we move it up and use one of the same day slots on a Tuesday?

## 2024-02-20 NOTE — Progress Notes (Signed)
 Pt here today for PPD placement , wheel formed

## 2024-02-20 NOTE — Telephone Encounter (Signed)
OK to place PPD .

## 2024-02-22 ENCOUNTER — Ambulatory Visit (INDEPENDENT_AMBULATORY_CARE_PROVIDER_SITE_OTHER)

## 2024-02-22 DIAGNOSIS — Z111 Encounter for screening for respiratory tuberculosis: Secondary | ICD-10-CM

## 2024-02-22 LAB — TB SKIN TEST
Induration: 0 mm
TB Skin Test: NEGATIVE

## 2024-02-22 NOTE — Progress Notes (Signed)
PPD Reading Note  PPD read and results entered in [employer].  Result: 0  mm induration.  Interpretation:Negative   If test not read within 48-72 hours of initial placement, patient advised to repeat in other arm 1-3 weeks after this test.  Allergic reaction:No

## 2024-02-27 ENCOUNTER — Encounter: Admitting: Family

## 2024-02-27 ENCOUNTER — Ambulatory Visit (INDEPENDENT_AMBULATORY_CARE_PROVIDER_SITE_OTHER): Admitting: Family

## 2024-02-27 ENCOUNTER — Encounter: Payer: Self-pay | Admitting: Family

## 2024-02-27 ENCOUNTER — Telehealth: Payer: Self-pay | Admitting: Family

## 2024-02-27 VITALS — BP 96/56 | HR 87 | Temp 98.6°F | Resp 16 | Ht 62.0 in | Wt 162.0 lb

## 2024-02-27 DIAGNOSIS — N926 Irregular menstruation, unspecified: Secondary | ICD-10-CM | POA: Insufficient documentation

## 2024-02-27 DIAGNOSIS — N8110 Cystocele, unspecified: Secondary | ICD-10-CM | POA: Insufficient documentation

## 2024-02-27 DIAGNOSIS — Z0000 Encounter for general adult medical examination without abnormal findings: Secondary | ICD-10-CM | POA: Insufficient documentation

## 2024-02-27 DIAGNOSIS — B353 Tinea pedis: Secondary | ICD-10-CM

## 2024-02-27 DIAGNOSIS — R350 Frequency of micturition: Secondary | ICD-10-CM | POA: Diagnosis not present

## 2024-02-27 DIAGNOSIS — Z114 Encounter for screening for human immunodeficiency virus [HIV]: Secondary | ICD-10-CM

## 2024-02-27 DIAGNOSIS — R779 Abnormality of plasma protein, unspecified: Secondary | ICD-10-CM | POA: Diagnosis not present

## 2024-02-27 DIAGNOSIS — B351 Tinea unguium: Secondary | ICD-10-CM | POA: Diagnosis not present

## 2024-02-27 DIAGNOSIS — Z1159 Encounter for screening for other viral diseases: Secondary | ICD-10-CM

## 2024-02-27 DIAGNOSIS — L309 Dermatitis, unspecified: Secondary | ICD-10-CM

## 2024-02-27 DIAGNOSIS — Z23 Encounter for immunization: Secondary | ICD-10-CM | POA: Diagnosis not present

## 2024-02-27 DIAGNOSIS — Z13220 Encounter for screening for lipoid disorders: Secondary | ICD-10-CM

## 2024-02-27 MED ORDER — OXYBUTYNIN CHLORIDE ER 5 MG PO TB24
5.0000 mg | ORAL_TABLET | Freq: Every day | ORAL | 2 refills | Status: AC
Start: 2024-02-27 — End: ?

## 2024-02-27 MED ORDER — TACROLIMUS 0.03 % EX OINT: TOPICAL_OINTMENT | Freq: Two times a day (BID) | CUTANEOUS | 0 refills | Status: AC

## 2024-02-27 MED ORDER — NORETHINDRONE ACET-ETHINYL EST 1-20 MG-MCG PO TABS: 1.0000 | ORAL_TABLET | Freq: Every day | ORAL | 0 refills | Status: AC

## 2024-02-27 NOTE — Assessment & Plan Note (Signed)
  Screening for human immunodeficiency virus (HIV) and hepatitis C Interested in HIV and hepatitis C screening as part of routine STD testing. - Order HIV and hepatitis C screening tests. - Pap up to date - Flu shot, Gardisil #1 and Tdap to day

## 2024-02-27 NOTE — Progress Notes (Signed)
 Complete physical exam  Patient: Grace Marshall    DOB: [birth year] 22 y.o.   MRN: [id number]  Chief Complaint  Patient presents with   Annual Exam    Patient is here for her physical    Subjective:    Grace Marshall is a 23 y.o. female who presents today for a complete physical exam.  Discussed the use of AI scribe software for clinical note transcription with the patient, who gave verbal consent to proceed.  History of Present Illness  Grace Marshall is a 23 year old female who presents for an updated physical exam.  She has bladder prolapse with frequent urination since childbirth. She discontinued therapy three years ago due to work constraints but continues exercises independently. She has not consulted a gynecologist or used medications for overactive bladder.  She experiences frequent migraines, previously documented at her former job but not at her current workplace, [employer]. She is considering obtaining a doctor's note for her new employer.  She reports prolonged menstrual bleeding since the insertion of her Nexplanon  implant a year ago, with periods lasting up to two weeks compared to her previous seven-day cycles. She avoids oral birth control due to concerns about weight gain observed in her family.  She has dry skin above her eyebrows, leading to discoloration, and uses an ineffective cream provided by her mother. She also has a recurrence of foot fungus with dark toenails and itching, previously treated with oral medication last year.  Her social history includes occasional alcohol use and no drug use. She is adopted and lacks detailed information about her biological mother's medical history. She is up to date on her vision but not on dental care, and she has [employer].  Immunizations:  Diet: fair Exercise:  goes to the gym regularly Pap Smear: normal, 4/25 Vision: up to date Dental: due, will look into scheduling   Most recent fall  risk assessment:    02/27/2024    9:51 AM  Fall Risk   Falls in the past year? 0  Number falls in past yr: 0  Injury with Fall? 0  Risk for fall due to : No Fall Risks  Follow up Falls evaluation completed     Most recent depression screenings:    02/27/2024    9:51 AM 08/22/2022    3:10 PM  PHQ 2/9 Scores  PHQ - 2 Score 0 0  PHQ- 9 Score 0 0    Patient Care Team: Daryl Setter, NP as PCP - General (Internal Medicine) Jerrye Darnel, PA-C (Obstetrics and Gynecology)   Review of Systems  Constitutional:  Negative for weight loss.  HENT:  Positive for congestion (allergies).   Skin:  Negative for rash.      Objective:    BP (!) 96/56 (BP Location: Right Arm, Patient Position: Sitting, Cuff Size: Normal)   Pulse 87   Temp 98.6 F (37 C) (Oral)   Resp 16   Ht 5' 2 (1.575 m)   Wt 162 lb (73.5 kg)   SpO2 100%   BMI 29.63 kg/m        Assessment & Plan:    Routine Health Maintenance and Physical Exam Immunization History  Administered Date(s) Administered   DTaP 04/20/2000, 10/19/2000, 12/21/2000, 12/12/2002, 05/20/2004   Dtap, Unspecified 12/12/2002, 05/20/2004   HIB (PRP-OMP) 04/20/2000, 10/19/2000, 12/21/2000, 03/28/2002   HIB, Unspecified 04/20/2000, 10/19/2000, 12/21/2000, 03/28/2002   HPV 9-valent 02/27/2024   Hep A, Unspecified 01/26/2005   Hep B, Unspecified  February 19, 2000, 04/20/2000, 10/19/2000   Hepatitis A, Ped/Adol-2 Dose 01/26/2005, 02/16/2006   Hepatitis B, PED/ADOLESCENT 2000-05[birth year]27, 04/20/2000, 10/19/2000   Influenza, Seasonal, Injecte, Preservative Fre 02/16/2006, 01/16/2023, 02/27/2024   Influenza-Unspecified 03/28/2002, 02/16/2006   MMR 03/28/2002, 05/20/2004   PFIZER(Purple Top)SARS-COV-2 Vaccination 08/20/2019, 09/10/2019   PPD Test 02/20/2024   Pfizer(Comirnaty)Fall Seasonal Vaccine 12 years and older 08/20/2019, 09/10/2019   Pneumococcal Conjugate-13 04/20/2000, 10/19/2000, 12/21/2000, 03/28/2002   Td 01/16/2012   Tdap 01/16/2012,  02/27/2024   Vaccinia,smallpox Monkeypox Vaccine Live,pf 03/28/2002, 01/26/2005    Health Maintenance  Topic Date Due   Pneumococcal Vaccine (1 of 2 - PPSV23, PCV20, or PCV21) 05/23/2002   HIV Screening  Never done   Meningococcal B Vaccine (1 of 2 - Standard) Never done   Hepatitis C Screening  Never done   COVID-19 Vaccine (5 - 2025-26 season) 12/24/2023   HPV VACCINES (2 - 3-dose series) 03/26/2024   Cervical Cancer Screening (Pap smear)  08/07/2026   DTaP/Tdap/Td (9 - Td or Tdap) 02/26/2034   Influenza Vaccine  Completed   Hepatitis B Vaccines 19-59 Average Risk  Discontinued    Discussed health benefits of physical activity, and encouraged her to engage in regular exercise appropriate for her age and condition.  Physical Exam  Constitutional: She is oriented to person, place, and time. She appears well-developed and well-nourished. No distress.  HENT:  Head: Normocephalic and atraumatic.  Right Ear: Tympanic membrane and ear canal normal.  Left Ear: Tympanic membrane and ear canal normal.  Mouth/Throat: Oropharynx is clear and moist.  Eyes: Pupils are equal, round, and reactive to light. No scleral icterus.  Neck: Normal range of motion. No thyromegaly present.  Cardiovascular: Normal rate and regular rhythm.   No murmur heard. Pulmonary/Chest: Effort normal and breath sounds normal. No respiratory distress. He has no wheezes. She has no rales. She exhibits no tenderness.  Abdominal: Soft. Bowel sounds are normal. She exhibits no distension and no mass. There is no tenderness. There is no rebound and no guarding.  Musculoskeletal: She exhibits no edema.  Lymphadenopathy:    She has no cervical adenopathy.  Neurological: She is alert and oriented to person, place, and time. She has normal patellar reflexes. She exhibits normal muscle tone. Coordination normal.  Skin: Skin is warm and dry. Slight discoloration of bilateral distal great toenails. Dry skin noted beneath eyes and  above eyebrows Psychiatric: She has a normal mood and affect. Her behavior is normal. Judgment and thought content normal.  Breast/Pelvis: deferred          Assessment & Plan:     Problem List Items Addressed This Visit       Unprioritized   Urinary frequency   Trial of Oxybutynin.       Relevant Medications   oxybutynin (DITROPAN-XL) 5 MG 24 hr tablet   Tinea pedis of both feet   Continue lamisil  spray.       Relevant Medications   tacrolimus (PROTOPIC) 0.03 % ointment   Preventative health care - Primary    Screening for human immunodeficiency virus (HIV) and hepatitis C Interested in HIV and hepatitis C screening as part of routine STD testing. - Order HIV and hepatitis C screening tests. - Pap up to date - Flu shot, Gardisil #1 and Tdap to day      Onychomycosis   She completed lamisil  12 weeks oral rx previously. I am not convinced that her current discoloration is fungal. Toenail clipping performed and will be sent for fungal analysis.  Relevant Medications   tacrolimus (PROTOPIC) 0.03 % ointment   Other Relevant Orders   Surgical pathology( [facility]/ [facility])   Irregular menses   Will give 3 month rx with loestrin to see if we can regulate her menses on Nexplanon .       Relevant Medications   norethindrone-ethinyl estradiol (MICROGESTIN) 1-20 MG-MCG tablet   Female bladder prolapse, acquired   Has done pelvic floor PT with some improvement. Discussed referral to UroGyn in the future if symptoms worsen.       Other Visit Diagnoses       Encounter for screening for HIV       Relevant Orders   HIV antibody (with reflex)     Encounter for hepatitis C screening test for low risk patient       Relevant Orders   Hepatitis C Antibody     Screening for lipoid disorders       Relevant Orders   Lipid panel     Elevated serum protein level       Relevant Orders   Comp Met (CMET)     Eczema, unspecified type       Relevant Medications    tacrolimus (PROTOPIC) 0.03 % ointment     Needs flu shot       Relevant Orders   Flu vaccine trivalent PF, 6mos and older(Flulaval,Afluria,Fluarix,Fluzone) (Completed)     Need for Tdap vaccination       Relevant Orders   Tdap vaccine greater than or equal to 7yo IM (Completed)     Need for HPV vaccination       Relevant Orders   HPV 9-valent vaccine,Recombinat (Completed)       Assessment and Plan Assessment & Plan     Return in about 8 weeks (around 04/23/2024) for nurse visit for Gardisil #2, and Men B vaccine.    Eleanor GORMAN Ponto, NP [facility]

## 2024-02-27 NOTE — Assessment & Plan Note (Signed)
 Has done pelvic floor PT with some improvement. Discussed referral to UroGyn in the future if symptoms worsen.

## 2024-02-27 NOTE — Addendum Note (Signed)
 Addended by: DARYL SETTER on: 02/27/2024 03:54 PM   Modules accepted: Orders

## 2024-02-27 NOTE — Assessment & Plan Note (Signed)
 She completed lamisil  12 weeks oral rx previously. I am not convinced that her current discoloration is fungal. Toenail clipping performed and will be sent for fungal analysis.

## 2024-02-27 NOTE — Addendum Note (Signed)
 Addended by: WELLS LEVORN HERO on: 02/27/2024 04:28 PM   Modules accepted: Orders

## 2024-02-27 NOTE — Assessment & Plan Note (Signed)
 Continue lamisil  spray.

## 2024-02-27 NOTE — Telephone Encounter (Signed)
Form received and put in provider's folder

## 2024-02-27 NOTE — Telephone Encounter (Signed)
 Pt dropped off new form for pcp to complete. Pt asks that it be faxed and that we call her when faxed. Fax number [phone]

## 2024-02-27 NOTE — Patient Instructions (Signed)
 VISIT SUMMARY:  During your visit, we discussed several health concerns including bladder prolapse, irregular menstruation, migraines, facial dermatitis, and foot fungus. We also addressed your contraceptive management and performed routine screenings for HIV and hepatitis C.  YOUR PLAN:  BLADDER PROLAPSE WITH URINARY FREQUENCY AND STRESS INCONTINENCE: You have chronic bladder prolapse causing frequent urination and stress incontinence. -Continue pelvic floor exercises. -Consider seeing a urogynecologist if symptoms worsen. -We can discuss fitting a pessary if your symptoms become uncomfortable. -I have prescribed Ditropan to help with urinary frequency and urgency. Please monitor for dry mouth.  IRREGULAR AND PROLONGED MENSTRUATION ON CONTRACEPTIVE IMPLANT: You have been experiencing irregular and prolonged menstruation with your Nexplanon  implant. -I have prescribed low-dose oral contraceptive pills for three months to help regulate your menstrual cycle. -Monitor for any changes in your menstrual pattern and any side effects.  CONTRACEPTIVE MANAGEMENT (NEXPLANON ): You have a Nexplanon  implant and have experienced changes in your menstrual cycle. -Continue with the Nexplanon  implant. -Use the prescribed low-dose oral contraceptive pills to help regulate your menstrual cycle.  MIGRAINE: You have frequent migraines and may need documentation for your new job. -We can provide documentation for your employer if needed.  ALLERGIC RHINITIS: You have symptoms of allergic rhinitis. -You can use over-the-counter Claritin or Zyrtec once daily to manage your allergy symptoms.  FACIAL DERMATITIS ABOVE EYEBROWS: You have persistent dry skin and discoloration above your eyebrows. -I have prescribed Protopic for your facial dermatitis. It is safe to use around your eyes.  SUSPECTED ONYCHOMYCOSIS AND TINEA PEDIS: You have darkened toenails and itching feet, which may be due to a fungal infection. -We  will perform a toenail clipping for a fungal culture. -Continue using over-the-counter Lamisil  cream or spray for your feet. -Use antibacterial soap and Lume cream for foot odor. -Wear cotton socks to help manage the condition.  SCREENING FOR HUMAN IMMUNODEFICIENCY VIRUS (HIV) AND HEPATITIS C: You are interested in routine STD testing. -We will order HIV and hepatitis C screening tests.

## 2024-02-27 NOTE — Assessment & Plan Note (Signed)
 Trial of Oxybutynin.

## 2024-02-27 NOTE — Assessment & Plan Note (Signed)
 Will give 3 month rx with loestrin to see if we can regulate her menses on Nexplanon .

## 2024-03-04 ENCOUNTER — Other Ambulatory Visit (INDEPENDENT_AMBULATORY_CARE_PROVIDER_SITE_OTHER)

## 2024-03-04 ENCOUNTER — Other Ambulatory Visit

## 2024-03-04 DIAGNOSIS — Z13220 Encounter for screening for lipoid disorders: Secondary | ICD-10-CM

## 2024-03-04 DIAGNOSIS — R779 Abnormality of plasma protein, unspecified: Secondary | ICD-10-CM

## 2024-03-04 DIAGNOSIS — Z114 Encounter for screening for human immunodeficiency virus [HIV]: Secondary | ICD-10-CM

## 2024-03-04 DIAGNOSIS — Z1159 Encounter for screening for other viral diseases: Secondary | ICD-10-CM

## 2024-03-04 NOTE — Addendum Note (Signed)
 Addended by: MARYLEN PRO A on: 03/04/2024 02:24 PM   Modules accepted: Orders

## 2024-03-05 ENCOUNTER — Ambulatory Visit: Payer: Self-pay | Admitting: Family

## 2024-03-05 NOTE — Telephone Encounter (Signed)
 Letter mailed out to pt

## 2024-03-06 LAB — COMPREHENSIVE METABOLIC PANEL WITH GFR
AG Ratio: 1.3 (calc) (ref 1.0–2.5)
ALT: 14 U/L (ref 6–29)
AST: 16 U/L (ref 10–30)
Albumin: 4.2 g/dL (ref 3.6–5.1)
Alkaline phosphatase (APISO): 55 U/L (ref 31–125)
BUN: 10 mg/dL (ref 7–25)
CO2: 26 mmol/L (ref 20–32)
Calcium: 9.5 mg/dL (ref 8.6–10.2)
Chloride: 104 mmol/L (ref 98–110)
Creat: 0.91 mg/dL (ref 0.50–0.96)
Globulin: 3.2 g/dL (ref 1.9–3.7)
Glucose, Bld: 76 mg/dL (ref 65–99)
Potassium: 3.9 mmol/L (ref 3.5–5.3)
Sodium: 139 mmol/L (ref 135–146)
Total Bilirubin: 0.4 mg/dL (ref 0.2–1.2)
Total Protein: 7.4 g/dL (ref 6.1–8.1)
eGFR: 90 mL/min/1.73m2 (ref >=60)

## 2024-03-06 LAB — LIPID PANEL
Cholesterol: 166 mg/dL (ref <200)
HDL: 56 mg/dL (ref >=50)
LDL Cholesterol (Calc): 97 mg/dL
Non-HDL Cholesterol (Calc): 110 mg/dL (ref <130)
Total CHOL/HDL Ratio: 3 (calc) (ref <5.0)
Triglycerides: 50 mg/dL (ref <150)

## 2024-03-06 LAB — HIV ANTIBODY (ROUTINE TESTING W REFLEX)
HIV 1&2 Ab, 4th Generation: NONREACTIVE
HIV FINAL INTERPRETATION: NEGATIVE

## 2024-03-06 LAB — HEPATITIS C ANTIBODY: Hepatitis C Ab: NONREACTIVE

## 2024-03-18 ENCOUNTER — Telehealth: Payer: Self-pay | Admitting: Family

## 2024-03-18 NOTE — Telephone Encounter (Signed)
 Pt dropped off form for pcp to complete. Pt asks that it be faxed and that she be called so she can pick up paper copy. Fax is on paper. Placed in pcp's tray.

## 2024-03-18 NOTE — Telephone Encounter (Signed)
 Form in provider's folder, to be completed

## 2024-03-24 NOTE — Telephone Encounter (Signed)
 Called patient twice but no answer, lvm for patient to know form is ready for pick up at [employer]

## 2024-03-24 NOTE — Telephone Encounter (Unsigned)
 Copied from CRM #[id number]. Topic: General - Other >> Mar 24, 2024  1:19 PM Grace Marshall wrote: Reason for CRM: Pt called in requesting update on a form that she dropped off on 11/25 and needed it to be completed. Pt stated that her job is requesting for this form to be completed by 12/03. Contacted [employer] and was told that it hasn't been completed just yet and I also confirmed the turnaround time which is 7-10 business days. I informed pt with this info. Pt is requesting a callback when this has been completed and ready for pick up.

## 2024-03-26 ENCOUNTER — Encounter: Admitting: Family

## 2024-03-31 LAB — CULTURE, FUNGUS WITHOUT SMEAR
CULTURE:: NO GROWTH
MICRO NUMBER:: 17206539
SPECIMEN QUALITY:: ADEQUATE

## 2024-04-21 ENCOUNTER — Telehealth: Payer: Self-pay

## 2024-04-21 NOTE — Telephone Encounter (Signed)
 Copied from CRM #[id number]. Topic: Medical Record Request - Records Request >> Apr 21, 2024 12:26 PM [name] wrote: Reason for CRM: Patient starts school in a week and is requesting record of TB test and record of flu vaccination. Patient can pick up records if needed.  C/b: [phone]

## 2024-05-14 ENCOUNTER — Encounter: Payer: Self-pay | Admitting: Physician Assistant

## 2024-05-14 ENCOUNTER — Ambulatory Visit (INDEPENDENT_AMBULATORY_CARE_PROVIDER_SITE_OTHER): Admitting: Physician Assistant

## 2024-05-14 VITALS — BP 110/71 | HR 104 | Temp 98.3°F | Resp 18 | Ht 62.0 in | Wt 154.6 lb

## 2024-05-14 DIAGNOSIS — R112 Nausea with vomiting, unspecified: Secondary | ICD-10-CM

## 2024-05-14 DIAGNOSIS — M25531 Pain in right wrist: Secondary | ICD-10-CM

## 2024-05-14 LAB — POC COVID19/FLU A&B COMBO
Covid Antigen, POC: NEGATIVE
Influenza A Antigen, POC: NEGATIVE
Influenza B Antigen, POC: NEGATIVE

## 2024-05-14 MED ORDER — ONDANSETRON 8 MG PO TBDP: 8.0000 mg | ORAL_TABLET | Freq: Three times a day (TID) | ORAL | 0 refills | Status: AC | PRN

## 2024-05-14 NOTE — Progress Notes (Signed)
 "  Acute Office Visit  Subjective:     Patient ID: Grace Marshall, female    DOB: 09/24/2000, 24 y.o.   MRN: [id number]  Chief Complaint  Patient presents with   Emesis   Diarrhea   Fatigue    HPI Patient is in today for acute visit.  Discussed the use of AI scribe software for clinical note transcription with the patient, who gave verbal consent to proceed.  History of Present Illness Grace Marshall is a 24 year old female who presents with symptoms consistent with a viral illness, including gastrointestinal symptoms and fatigue.  Symptoms began three days ago with fatigue and increased tiredness despite adequate sleep. She experienced chills and hot flashes later that night. The following day, she developed diarrhea, with approximately five episodes per day, and vomiting, which has prevented her from keeping food or fluids down. She has not eaten since Monday, except for an attempt to eat goldfish crackers last night, which she vomited this morning. Attempts to drink water have also resulted in vomiting. No blood in the vomit or stool. No coughing, sore throat, or ear pain. No fever reported, though she experienced hot sweats. She has not checked her temperature at home.  Her four-year-old son had the flu over the weekend, and she suspects she contracted it from him. He also had diarrhea and stomach discomfort. Patient had received her flu shot 02/27/2024.  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 She describes the pain as significant initially, but it has improved since yesterday. The pain is located on the wrist, and she can move it but experiences discomfort when typing or sleeping unless the wrist is positioned in a specific way. She is left-handed, so the injury does not affect her writing.     Review of Systems  All other systems reviewed and are negative.       Objective:    BP 110/71    Pulse (!) 104   Temp 98.3 F (36.8 C)   Resp 18   Ht 5' 2 (1.575 m)   Wt 154 lb 9.6 oz (70.1 kg)   SpO2 100%   BMI 28.28 kg/m    Physical Exam Constitutional:      Appearance: Normal appearance. She is not toxic-appearing.  HENT:     Mouth/Throat:     Pharynx: Oropharynx is clear.  Eyes:     Extraocular Movements: Extraocular movements intact.  Cardiovascular:     Rate and Rhythm: Normal rate and regular rhythm.     Pulses: Normal pulses.     Heart sounds: Normal heart sounds.  Pulmonary:     Effort: Pulmonary effort is normal.  Abdominal:     General: Abdomen is flat. There is no distension.     Palpations: Abdomen is soft.     Tenderness: There is no abdominal tenderness.     Comments: No TTP throughout.  Musculoskeletal:        General: Normal range of motion.     Cervical back: Normal range of motion.     Comments: Right wrist is without bony TTP. No objective swelling. No overlying skin changes. Flexion and extension of wrist with strength intact against resistance.  No anatomic snuff box TTP. Good perfusion distally.  Neurological:     Mental Status: She is alert and oriented to person, place, and time.  Psychiatric:  Behavior: Behavior normal.     Results for orders placed or performed in visit on 05/14/24  POC Covid19/Flu A&B Antigen  Result Value Ref Range   Influenza A Antigen, POC Negative Negative   Influenza B Antigen, POC Negative Negative   Covid Antigen, POC Negative Negative        Assessment & Plan:   Problem List Items Addressed This Visit   None Visit Diagnoses       Nausea and vomiting, unspecified vomiting type    -  Primary   Relevant Orders   POC Covid19/Flu A&B Antigen (Completed)       Meds ordered this encounter  Medications   ondansetron  (ZOFRAN -ODT) 8 MG disintegrating tablet    Sig: Take 1 tablet (8 mg total) by mouth every 8 (eight) hours as needed for nausea or vomiting.    Dispense:  20 tablet    Refill:   0   Assessment and Plan Assessment & Plan Acute viral gastroenteritis - Likely viral etiology, possibly influenza B. Dehydration risk due to vomiting and diarrhea. Tamiflu  not indicated. - Prescribed Zofran  ODT for nausea every eight hours as needed. - Advised hydration with small sips of water. - Encouraged regular food intake for nutrition. - Advised regular temperature monitoring and use of Tylenol  or ibuprofen  for fever. - Provided work note for days off. - Advised to contact clinic if unable to keep fluids or food down for potential hospitalization. - Do not feel as though laboratory work up is needed to assess for electrolyte derangement given brevity of illness.  Right wrist pain after fall - Likely bone bruise or sprain. Low suspicion for fracture. Symptoms improving. No bony TTP.  - Advised activity modification and wrist guarding. - Offered plain film evaluation, but will hold off for now after mutual decision making with patient - particularly given her symptom improvement and benign exam. - Instructed to monitor symptoms and contact clinic if pain persists or worsens for potential x-ray.    No follow-ups on file.  Honora Seip, PA-C   "
